# Patient Record
Sex: Male | Born: 1945
Health system: Southern US, Community
[De-identification: ages and names within clinical notes are randomized; demographics above are authoritative.]

## PROBLEM LIST (undated history)

## (undated) DIAGNOSIS — R519 Headache, unspecified: Secondary | ICD-10-CM

## (undated) DIAGNOSIS — R51 Headache: Secondary | ICD-10-CM

## (undated) DIAGNOSIS — E785 Hyperlipidemia, unspecified: Secondary | ICD-10-CM

## (undated) DIAGNOSIS — N200 Calculus of kidney: Secondary | ICD-10-CM

## (undated) DIAGNOSIS — R030 Elevated blood-pressure reading, without diagnosis of hypertension: Secondary | ICD-10-CM

## (undated) DIAGNOSIS — I10 Essential (primary) hypertension: Secondary | ICD-10-CM

## (undated) DIAGNOSIS — K219 Gastro-esophageal reflux disease without esophagitis: Secondary | ICD-10-CM

## (undated) DIAGNOSIS — M5412 Radiculopathy, cervical region: Secondary | ICD-10-CM

## (undated) DIAGNOSIS — N1831 Chronic kidney disease, stage 3a: Secondary | ICD-10-CM

## (undated) DIAGNOSIS — R7303 Prediabetes: Secondary | ICD-10-CM

## (undated) DIAGNOSIS — G43909 Migraine, unspecified, not intractable, without status migrainosus: Secondary | ICD-10-CM

## (undated) HISTORY — DX: Gastro-esophageal reflux disease without esophagitis: K21.9

## (undated) HISTORY — DX: Headache: R51

## (undated) HISTORY — DX: Chronic kidney disease, stage 3a: N18.31

## (undated) HISTORY — DX: Headache, unspecified: R51.9

## (undated) HISTORY — DX: Hyperlipidemia, unspecified: E78.5

## (undated) HISTORY — DX: Calculus of kidney: N20.0

## (undated) HISTORY — PX: HERNIA REPAIR: SHX51

## (undated) HISTORY — DX: Prediabetes: R73.03

## (undated) HISTORY — PX: LITHOTRIPSY: SUR834

## (undated) HISTORY — DX: Migraine, unspecified, not intractable, without status migrainosus: G43.909

## (undated) HISTORY — DX: Essential (primary) hypertension: I10

## (undated) HISTORY — DX: Radiculopathy, cervical region: M54.12

## (undated) HISTORY — DX: Elevated blood-pressure reading, without diagnosis of hypertension: R03.0

---

## 2012-11-15 LAB — PSA

## 2013-06-28 LAB — HM COLONOSCOPY

## 2015-02-19 DIAGNOSIS — G43109 Migraine with aura, not intractable, without status migrainosus: Secondary | ICD-10-CM | POA: Insufficient documentation

## 2016-02-20 ENCOUNTER — Telehealth: Payer: Self-pay | Admitting: Behavioral Health

## 2016-02-20 ENCOUNTER — Encounter: Payer: Self-pay | Admitting: Behavioral Health

## 2016-02-20 NOTE — Telephone Encounter (Signed)
Pre-Visit Call completed with patient and chart updated.   Pre-Visit Info documented in Specialty Comments under SnapShot.    

## 2016-02-23 ENCOUNTER — Encounter: Payer: Self-pay | Admitting: Family Medicine

## 2016-02-23 ENCOUNTER — Ambulatory Visit (INDEPENDENT_AMBULATORY_CARE_PROVIDER_SITE_OTHER): Payer: Medicare Other | Admitting: Family Medicine

## 2016-02-23 VITALS — BP 158/90 | HR 59 | Temp 97.6°F | Ht 67.0 in | Wt 154.2 lb

## 2016-02-23 DIAGNOSIS — I10 Essential (primary) hypertension: Secondary | ICD-10-CM | POA: Diagnosis not present

## 2016-02-23 DIAGNOSIS — Z130 Encounter for screening for diseases of the blood and blood-forming organs and certain disorders involving the immune mechanism: Secondary | ICD-10-CM

## 2016-02-23 DIAGNOSIS — M5010 Cervical disc disorder with radiculopathy, unspecified cervical region: Secondary | ICD-10-CM

## 2016-02-23 DIAGNOSIS — Z8639 Personal history of other endocrine, nutritional and metabolic disease: Secondary | ICD-10-CM

## 2016-02-23 DIAGNOSIS — Z125 Encounter for screening for malignant neoplasm of prostate: Secondary | ICD-10-CM | POA: Diagnosis not present

## 2016-02-23 LAB — CBC
HCT: 44.9 % (ref 39.0–52.0)
Hemoglobin: 15.2 g/dL (ref 13.0–17.0)
MCHC: 33.8 g/dL (ref 30.0–36.0)
MCV: 90.3 fl (ref 78.0–100.0)
Platelets: 222 10*3/uL (ref 150.0–400.0)
RBC: 4.97 Mil/uL (ref 4.22–5.81)
RDW: 14 % (ref 11.5–15.5)
WBC: 7.5 10*3/uL (ref 4.0–10.5)

## 2016-02-23 LAB — COMPREHENSIVE METABOLIC PANEL
ALT: 14 U/L (ref 0–53)
AST: 18 U/L (ref 0–37)
Albumin: 4.8 g/dL (ref 3.5–5.2)
Alkaline Phosphatase: 59 U/L (ref 39–117)
BILIRUBIN TOTAL: 1.1 mg/dL (ref 0.2–1.2)
BUN: 12 mg/dL (ref 6–23)
CALCIUM: 10.2 mg/dL (ref 8.4–10.5)
CHLORIDE: 102 meq/L (ref 96–112)
CO2: 29 meq/L (ref 19–32)
CREATININE: 1.06 mg/dL (ref 0.40–1.50)
GFR: 73.83 mL/min (ref >60.00)
GLUCOSE: 77 mg/dL (ref 70–99)
Potassium: 4.8 mEq/L (ref 3.5–5.1)
SODIUM: 140 meq/L (ref 135–145)
Total Protein: 7.3 g/dL (ref 6.0–8.3)

## 2016-02-23 LAB — LIPID PANEL
CHOL/HDL RATIO: 4
Cholesterol: 204 mg/dL — ABNORMAL HIGH (ref 0–200)
HDL: 46.9 mg/dL (ref >39.00)
LDL CALC: 126 mg/dL — AB (ref 0–99)
NONHDL: 157.15
TRIGLYCERIDES: 154 mg/dL — AB (ref 0.0–149.0)
VLDL: 30.8 mg/dL (ref 0.0–40.0)

## 2016-02-23 LAB — PSA: PSA: 1.77 ng/mL (ref 0.10–4.00)

## 2016-02-23 MED ORDER — GABAPENTIN 400 MG PO CAPS: 1200.0000 mg | ORAL_CAPSULE | Freq: Every day | ORAL | 5 refills | Status: DC

## 2016-02-23 MED ORDER — LISINOPRIL 5 MG PO TABS: 5.0000 mg | ORAL_TABLET | Freq: Every day | ORAL | 5 refills | Status: DC

## 2016-02-23 NOTE — Progress Notes (Signed)
[facility] [address], [place], [place] [address] [phone]  Date:  02/23/2016   Name:  Kendrick Ledyard   DOB:  01-10-1946   MRN:  [id number]  PCP:  Lamar Blinks, MD    Chief Complaint: Establish Care (Pt here to est care. Former pt of [facility]. )   History of Present Illness:  Paul Carlson is a 70 y.o. very pleasant male patient who presents with the following:  Here today to establish care.  He has a history of HTN (had been on lisinopril in the past, stopped taking it due to low BP.  He was last on it a couple of years ago) Also cervical radiculopathy and migraine HA He had been getting his care at [facility] but wanted to move somewhere closer to home.   He prefers to get any MRI, operations at [facility]  He has had neck problems for many years.  Most recent c spine MRI in 2015- showed multilevel, multifactoral degenerative change with moderate bilateral foraminal stenosis and mild central spinal stenosis at C4/5 and C5/6  He is a Theme park manager with the [employer] He is from [place] originally but has been in this area for some time He has 3 children with his wife.  He has 8 grands- they live in [place], [place], [place] and [place].    He last had labs about one year ago.   He last ate at breakfast time  His home BP was 140/80s at home He does walk for exercise- about 45- 60 min brisk walk daily He is married, he and his wife have a dog  There are no active problems to display for this patient.   Past Medical History:  Diagnosis Date  . Borderline hypertension   . Cervical radiculopathy   . Migraines     Past Surgical History:  Procedure Laterality Date  . HERNIA REPAIR    . LITHOTRIPSY      Social History  Substance Use Topics  . Smoking status: Not on file  . Smokeless tobacco: Not on file  . Alcohol use Not on file    Family History  Problem Relation Age of Onset  . Alzheimer's disease Father   .  Hypertension Father   . Kidney disease Father   . Arthritis Father   . Cancer Paternal Grandfather     Prostate Cancer  . Alcohol abuse Neg Hx     Allergies  Allergen Reactions  . Azithromycin Other (See Comments)    Pt. reported that it gave him severe stomach cramps.  . Pollen Extract Other (See Comments)    Pt. reports nasal congestion  . Penicillins Rash    Medication list has been reviewed and updated.  Current Outpatient Prescriptions on File Prior to Visit  Medication Sig Dispense Refill  . acetaminophen (TYLENOL) 650 MG CR tablet Take 650 mg by mouth at bedtime as needed for pain.    Marland Kitchen gabapentin (NEURONTIN) 400 MG capsule Take 1,200 mg by mouth at bedtime.    Marland Kitchen ibuprofen (ADVIL,MOTRIN) 200 MG tablet Take 600 mg by mouth as needed.     No current facility-administered medications on file prior to visit.     Review of Systems:  As per HPI- otherwise negative.   Physical Examination: Vitals:   02/23/16 1413  BP: (!) 184/94  Pulse: (!) 59  Temp: 97.6 F (36.4 C)   Body mass index is 24.15 kg/m.  Vitals:  02/23/16 1413  Weight: 154 lb 3.2 oz (69.9 kg)  Height: 5\' 7"  (1.702 m)   Body mass index is 24.15 kg/m. Ideal Body Weight: Weight in (lb) to have BMI = 25: 159.3  GEN: WDWN, NAD, Non-toxic, A & O x 3, looks well, healthy and fit for age 79: Atraumatic, Normocephalic. Neck supple. No masses, No LAD.  Bilateral TM wnl, oropharynx normal.  PEERL,EOMI.   Ears and Nose: No external deformity. CV: RRR, No M/G/R. No JVD. No thrill. No extra heart sounds. PULM: CTA B, no wheezes, crackles, rhonchi. No retractions. No resp. distress. No accessory muscle use. EXTR: No c/c/e NEURO Normal gait. Normal strength and DTR of both arms PSYCH: Normally interactive. Conversant. Not depressed or anxious appearing.  Calm demeanor.    Assessment and Plan: History of hyperlipidemia - Plan: Comprehensive metabolic panel, Lipid panel  Essential hypertension - Plan:  Comprehensive metabolic panel, lisinopril (PRINIVIL,ZESTRIL) 5 MG tablet  Screening for prostate cancer - Plan: PSA  Screening for deficiency anemia - Plan: CBC  Cervical disc disorder with radiculopathy of cervical region - Plan: gabapentin (NEURONTIN) 400 MG capsule  Here today to establish care Will check labs today- history of hyperlipidemia and he is not on chl medication at this time Continue gabapentin for his chronic neck issues.  Recent MRI showed foraminal and central spinal stenosis Will start on 5mg  of lisinopril today, he will continue to monitor his BP Await labs- plan follow-up pending lab results He prefers to try and get his pneumonia vaccine at the drug store if he can  Signed Lamar Blinks, MD

## 2016-02-23 NOTE — Patient Instructions (Signed)
It was very nice to see you today- have a wonderful beach trip! I will be in touch with your labs asap Please start on 5mg  of lisinopril once a day for your blood pressure.  However if you are running lower than 120/80 on a regular basis please let me know   Please do schedule a visit with your GI doctor at the [facility] [phone] for a colonoscopy later on this year or early next year

## 2016-02-23 NOTE — Progress Notes (Signed)
Pre visit review using our clinic review tool, if applicable. No additional management support is needed unless otherwise documented below in the visit note. 

## 2016-03-04 ENCOUNTER — Encounter: Payer: Self-pay | Admitting: Family Medicine

## 2016-08-23 ENCOUNTER — Other Ambulatory Visit: Payer: Self-pay | Admitting: Family Medicine

## 2016-08-23 DIAGNOSIS — M5010 Cervical disc disorder with radiculopathy, unspecified cervical region: Secondary | ICD-10-CM

## 2016-11-08 ENCOUNTER — Ambulatory Visit (INDEPENDENT_AMBULATORY_CARE_PROVIDER_SITE_OTHER): Payer: Medicare Other | Admitting: Family Medicine

## 2016-11-08 ENCOUNTER — Encounter: Payer: Self-pay | Admitting: Family Medicine

## 2016-11-08 VITALS — BP 138/60 | HR 65 | Temp 98.0°F | Ht 67.0 in | Wt 152.6 lb

## 2016-11-08 DIAGNOSIS — J301 Allergic rhinitis due to pollen: Secondary | ICD-10-CM

## 2016-11-08 MED ORDER — CETIRIZINE HCL 10 MG PO TABS: 10.0000 mg | ORAL_TABLET | Freq: Every day | ORAL | 2 refills | Status: DC

## 2016-11-08 MED ORDER — FLUTICASONE PROPIONATE 50 MCG/ACT NA SUSP: 2.0000 | Freq: Every day | NASAL | 2 refills | Status: DC

## 2016-11-08 NOTE — Progress Notes (Signed)
Chief Complaint  Patient presents with  . URI    swollen gland x 2 mos, drainage,cough(product-white)    Paul Carlson here for URI complaints.  Duration: 2 months  Associated symptoms: sinus congestion, rhinorrhea, ear fullness, sore throat, chest tightness and cough Denies: sinus pain, itchy watery eyes, ear drainage, shortness of breath, myalgia and fevers/rigors Treatment to date: Dayquil, Cough drops Sick contacts: No  ROS:  Const: Denies fevers HEENT: As noted in HPI Lungs: No SOB  Past Medical History:  Diagnosis Date  . Borderline hypertension   . Cervical radiculopathy   . Frequent headaches   . GERD (gastroesophageal reflux disease)   . Migraines    Family History  Problem Relation Age of Onset  . Alzheimer's disease Father   . Hypertension Father   . Kidney disease Father   . Arthritis Father   . Cancer Paternal Grandfather     Prostate Cancer  . Alcohol abuse Neg Hx     BP 138/60 (BP Location: Left Arm, Patient Position: Sitting, Cuff Size: Normal)   Pulse 65   Temp 98 F (36.7 C) (Oral)   Ht 5\' 7"  (1.702 m)   Wt 152 lb 9.6 oz (69.2 kg)   SpO2 98%   BMI 23.90 kg/m  General: Awake, alert, appears stated age HEENT: AT, [place], ears patent b/l and TM's neg, nares patent w/o discharge, pharynx pink and without exudates, MMM Neck: No masses or asymmetry, Noted enlarged submandibular glands b/l Heart: RRR, no bruits Lungs: CTAB, no accessory muscle use Psych: Age appropriate judgment and insight, normal mood and affect   Allergic rhinitis due to pollen, unspecified seasonality - Plan: cetirizine (ZYRTEC ALLERGY) 10 MG tablet, fluticasone (FLONASE) 50 MCG/ACT nasal spray  Orders as above. Continue to push fluids, practice good hand hygiene, cover mouth when coughing. F/u prn. If starting to experience fevers, shaking, or shortness of breath, seek immediate care. Pt voiced understanding and agreement to the plan.  [place], DO 11/08/16 2:22  PM

## 2016-11-08 NOTE — Patient Instructions (Signed)
Claritin (loratadine), Allegra (fexofenadine), Zyrtec (cetirizine); these are listed in order from weakest to strongest. Generic, and therefore cheaper, options are in the parentheses.   Flonase (fluticasone); nasal spray that is over the counter. 2 sprays each nostril, once daily. Aim towards the same side eye when you spray.  There are available OTC, and the generic versions, which may be cheaper, are in parentheses. Show this to a pharmacist if you have trouble finding any of these items.  Continue to push fluids, practice good hand hygiene, and cover your mouth if you cough.  If you start having fevers, shaking or shortness of breath, seek immediate care.  

## 2016-11-08 NOTE — Progress Notes (Signed)
Pre visit review using our clinic review tool, if applicable. No additional management support is needed unless otherwise documented below in the visit note. 

## 2017-01-20 DIAGNOSIS — S63502A Unspecified sprain of left wrist, initial encounter: Secondary | ICD-10-CM | POA: Diagnosis not present

## 2017-01-20 DIAGNOSIS — M25532 Pain in left wrist: Secondary | ICD-10-CM | POA: Diagnosis not present

## 2017-02-21 ENCOUNTER — Other Ambulatory Visit: Payer: Self-pay | Admitting: Family Medicine

## 2017-02-21 DIAGNOSIS — M5010 Cervical disc disorder with radiculopathy, unspecified cervical region: Secondary | ICD-10-CM

## 2017-03-03 ENCOUNTER — Ambulatory Visit: Payer: Medicare Other | Admitting: Internal Medicine

## 2017-03-22 DIAGNOSIS — H109 Unspecified conjunctivitis: Secondary | ICD-10-CM | POA: Diagnosis not present

## 2017-03-22 DIAGNOSIS — H5712 Ocular pain, left eye: Secondary | ICD-10-CM | POA: Diagnosis not present

## 2017-03-22 DIAGNOSIS — Z87891 Personal history of nicotine dependence: Secondary | ICD-10-CM | POA: Diagnosis not present

## 2017-03-22 DIAGNOSIS — H578 Other specified disorders of eye and adnexa: Secondary | ICD-10-CM | POA: Diagnosis not present

## 2017-03-22 DIAGNOSIS — H1032 Unspecified acute conjunctivitis, left eye: Secondary | ICD-10-CM | POA: Diagnosis not present

## 2017-03-22 DIAGNOSIS — H538 Other visual disturbances: Secondary | ICD-10-CM | POA: Diagnosis not present

## 2017-03-22 DIAGNOSIS — Z880 Allergy status to penicillin: Secondary | ICD-10-CM | POA: Diagnosis not present

## 2017-03-22 DIAGNOSIS — Z881 Allergy status to other antibiotic agents status: Secondary | ICD-10-CM | POA: Diagnosis not present

## 2017-03-22 DIAGNOSIS — I10 Essential (primary) hypertension: Secondary | ICD-10-CM | POA: Diagnosis not present

## 2017-03-22 DIAGNOSIS — L299 Pruritus, unspecified: Secondary | ICD-10-CM | POA: Diagnosis not present

## 2017-03-23 ENCOUNTER — Telehealth: Payer: Self-pay | Admitting: Behavioral Health

## 2017-03-23 NOTE — Telephone Encounter (Signed)
[employer] note received via fax  Call Date: 03/22/17 Time: 5:11 PM   Caller: Paul Carlson Return number: [phone]  Nurse: Margretta Ditty, RN  Chief Complaint: Eye redness  Reason for call: Caller said that his left eye lid is swollen, red, draining, itching & pain. The temple area is numb & painful with blurred vision. Swelling of eyelid is down to pupil. This has been going on for a week. No fever.  Guideline: Eye-Pus or Discharge  Disposition: See Physician within 4 Hours  Called & spoke with patient. He voiced that he was seen at [facility] the same day as the call & informed that he had worsening pink eye; patient was given an antibiotic for his eye to take for 10 days & see an ophthalmologist if symptoms worsen.

## 2017-04-23 ENCOUNTER — Other Ambulatory Visit: Payer: Self-pay | Admitting: Family Medicine

## 2017-04-23 DIAGNOSIS — M5010 Cervical disc disorder with radiculopathy, unspecified cervical region: Secondary | ICD-10-CM

## 2017-05-16 ENCOUNTER — Telehealth: Payer: Self-pay | Admitting: Family Medicine

## 2017-05-16 NOTE — Telephone Encounter (Signed)
Spoke with patient regarding AWV. Explained that difference between AWV and CPE. Pt stated that he will give office a call back to schedule both appointments because he needs a CPE before the end of the year.

## 2017-06-25 ENCOUNTER — Other Ambulatory Visit: Payer: Self-pay | Admitting: Family Medicine

## 2017-06-25 DIAGNOSIS — M5010 Cervical disc disorder with radiculopathy, unspecified cervical region: Secondary | ICD-10-CM

## 2017-06-28 ENCOUNTER — Other Ambulatory Visit: Payer: Self-pay | Admitting: Family Medicine

## 2017-06-28 DIAGNOSIS — M5010 Cervical disc disorder with radiculopathy, unspecified cervical region: Secondary | ICD-10-CM

## 2017-07-20 DIAGNOSIS — M5412 Radiculopathy, cervical region: Secondary | ICD-10-CM | POA: Diagnosis not present

## 2017-07-20 DIAGNOSIS — G5603 Carpal tunnel syndrome, bilateral upper limbs: Secondary | ICD-10-CM | POA: Diagnosis not present

## 2017-07-20 DIAGNOSIS — R200 Anesthesia of skin: Secondary | ICD-10-CM | POA: Insufficient documentation

## 2017-07-20 DIAGNOSIS — M5030 Other cervical disc degeneration, unspecified cervical region: Secondary | ICD-10-CM | POA: Diagnosis not present

## 2017-07-20 DIAGNOSIS — M4802 Spinal stenosis, cervical region: Secondary | ICD-10-CM | POA: Diagnosis not present

## 2017-07-27 DIAGNOSIS — M5412 Radiculopathy, cervical region: Secondary | ICD-10-CM | POA: Diagnosis not present

## 2017-07-27 DIAGNOSIS — M436 Torticollis: Secondary | ICD-10-CM | POA: Diagnosis not present

## 2017-07-27 DIAGNOSIS — R200 Anesthesia of skin: Secondary | ICD-10-CM | POA: Diagnosis not present

## 2017-07-27 DIAGNOSIS — R202 Paresthesia of skin: Secondary | ICD-10-CM | POA: Diagnosis not present

## 2017-08-03 DIAGNOSIS — R202 Paresthesia of skin: Secondary | ICD-10-CM | POA: Diagnosis not present

## 2017-08-03 DIAGNOSIS — M436 Torticollis: Secondary | ICD-10-CM | POA: Diagnosis not present

## 2017-08-03 DIAGNOSIS — M5412 Radiculopathy, cervical region: Secondary | ICD-10-CM | POA: Diagnosis not present

## 2017-08-03 DIAGNOSIS — R200 Anesthesia of skin: Secondary | ICD-10-CM | POA: Diagnosis not present

## 2017-08-10 DIAGNOSIS — R202 Paresthesia of skin: Secondary | ICD-10-CM | POA: Diagnosis not present

## 2017-08-10 DIAGNOSIS — M436 Torticollis: Secondary | ICD-10-CM | POA: Diagnosis not present

## 2017-08-10 DIAGNOSIS — M5412 Radiculopathy, cervical region: Secondary | ICD-10-CM | POA: Diagnosis not present

## 2017-08-10 DIAGNOSIS — R200 Anesthesia of skin: Secondary | ICD-10-CM | POA: Diagnosis not present

## 2017-08-23 NOTE — Progress Notes (Addendum)
[facility] [address], [place], [place] [address] [phone]  Date:  08/24/2017   Name:  VIRAAJ Carlson   DOB:  [birth year]   MRN:  [id number]  PCP:  Darreld Mclean, MD    Chief Complaint: Shoulder Pain   History of Present Illness:  Paul Carlson is a 72 y.o. very pleasant male patient who presents with the following:  I last saw him in the office 02/2016: Here today to establish care.  He has a history of HTN (had been on lisinopril in the past, stopped taking it due to low BP.  He was last on it a couple of years ago) Also cervical radiculopathy and migraine HA He had been getting his care at [facility] but wanted to move somewhere closer to home.   He prefers to get any MRI, operations at [facility]  He has had neck problems for many years.  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  He has 8 grands- they live in [place], [place], [place] and [place].   His home BP was 140/80s at home He does walk for exercise- about 45- 60 min brisk walk daily  Hep C: not done yet, order today Pneumonia vaccine: Flu vaccine: done Labs done 8/17 colonoscopy 2014- he was asked to come back in 3 years   He is being seen at the spine center in [place], Dr. Octavio Manns. They referred him to pain management. He is on gabapentin and pain mangt added tylenol and ibuprofen. They talked about doing an injection but he felt like he was doing better so they did not do it at that time.   He did some PT recently but does not feel like he is getting better  He had his most recent MRI in 2015  BP Readings from Last 3 Encounters:  08/24/17 (!) 144/84  11/08/16 138/60   02/23/16 (!) 158/90   He did have breakfast this am- sausage biscuit with cheese He stopped taking his BP meds as he had been forgetting to take it and just stopped getting it refilled However he is willing to start taking this again He is overdue for a colonoscopy and needs a referral to GI  He brings in a typed page long document regarding the symptoms in his right hand and arm - will scan into chart for him  Patient Active Problem List   Diagnosis Date Noted  . History of hyperlipidemia 02/23/2016  . Essential hypertension 02/23/2016  . Cervical disc disorder with radiculopathy of cervical region 02/23/2016  . Migraine with aura and without status migrainosus, not intractable 02/19/2015    Past Medical History:  Diagnosis Date  . Borderline hypertension   . Cervical radiculopathy   . Frequent headaches   . GERD (gastroesophageal reflux disease)   . Migraines     Past Surgical History:  Procedure Laterality Date  . HERNIA REPAIR    . LITHOTRIPSY      Social History   Tobacco Use  . Smoking status: Never Smoker  . Smokeless tobacco: Never Used  Substance Use Topics  . Alcohol use: No  . Drug use: Not on  file    Family History  Problem Relation Age of Onset  . Alzheimer's disease Father   . Hypertension Father   . Kidney disease Father   . Arthritis Father   . Cancer Paternal Grandfather        Prostate Cancer  . Alcohol abuse Neg Hx     Allergies  Allergen Reactions  . Azithromycin Other (See Comments)    Pt. reported that it gave him severe stomach cramps.  . Pollen Extract Other (See Comments)    Pt. reports nasal congestion  . Penicillins Rash    Medication list has been reviewed and updated.  Current Outpatient Medications on File Prior to Visit  Medication Sig Dispense Refill  . acetaminophen (TYLENOL) 650 MG CR tablet Take 650 mg by mouth at bedtime as needed for pain.    Marland Kitchen aspirin-acetaminophen-caffeine (EXCEDRIN MIGRAINE) 250-250-65 MG  tablet Take 1-2 tablets by mouth as needed    . ibuprofen (ADVIL,MOTRIN) 200 MG tablet Take 600 mg by mouth as needed.    . cetirizine (ZYRTEC ALLERGY) 10 MG tablet Take 1 tablet (10 mg total) by mouth daily. (Patient not taking: Reported on 08/24/2017) 30 tablet 2  . fluticasone (FLONASE) 50 MCG/ACT nasal spray Place 2 sprays into both nostrils daily. (Patient not taking: Reported on 08/24/2017) 16 g 2   No current facility-administered medications on file prior to visit.     Review of Systems:  As per HPI- otherwise negative. No fever or chills No CP or SOB  Physical Examination: Vitals:   08/24/17 1210  BP: (!) 144/84  Pulse: 66  Resp: 16  Temp: 98.5 F (36.9 C)  SpO2: 97%   Vitals:   08/24/17 1210  Weight: 156 lb 3.2 oz (70.9 kg)  Height: 5\' 7"  (1.702 m)   Body mass index is 24.46 kg/m. Ideal Body Weight: Weight in (lb) to have BMI = 25: 159.3  GEN: WDWN, NAD, Non-toxic, A & O x 3, looks well, normal weight HEENT: Atraumatic, Normocephalic. Neck supple. No masses, No LAD. Ears and Nose: No external deformity. CV: RRR, No M/G/R. No JVD. No thrill. No extra heart sounds. PULM: CTA B, no wheezes, crackles, rhonchi. No retractions. No resp. distress. No accessory muscle use. EXTR: No c/c/e NEURO Normal gait.  PSYCH: Normally interactive. Conversant. Not depressed or anxious appearing.  Calm demeanor.  Normal strength of BUE, cannot reproduce numbness in right arm or pain with ROM of arms or neck   Assessment and Plan: History of hyperlipidemia - Plan: Lipid panel  Essential hypertension - Plan: lisinopril (PRINIVIL,ZESTRIL) 5 MG tablet, CBC, Comprehensive metabolic panel  Cervical disc disorder with radiculopathy of cervical region - Plan: predniSONE (DELTASONE) 20 MG tablet, gabapentin (NEURONTIN) 400 MG capsule  Screening for colon cancer - Plan: Ambulatory referral to Gastroenterology  Screening for prostate cancer - Plan: PSA  Encounter for hepatitis C  screening test for low risk patient - Plan: Hepatitis C antibody  Following up after a long absence with a few concerns It was nice to see you again today!  Please come in for fasting labs at your convenience and we will check your cholesterol, etc Please start back on your lisinopril for your blood pressure I also refilled your gabapentin Please use the prednisone for 6 days- I am hoping that this will help with your neck and arm symptoms. While you are on the prednisone do not take NSAIDs (such as ibuprofen) Please do contact Dr. Octavio Manns to arrange a follow-up visit for  your neck.  He would probably be the best person to order an MRI for you as he can also advise you about what to do with the results I will set you up to see GI in [facility] for a repeat colonoscopy   Restart lisinopril Will try prednisone for his neck pain.  Explained that generally once a spine specialist is seeing someone for a spine issue I would defer further MRI to that specialist.  He will schedule to see Dr. Octavio Manns soon Signed Lamar Blinks, MD 2/19- received his labs- message to pt  Hepatitis C screening is negative as expected Your blood count is normal Metabolic profile is also normal PSA is in normal range and lower than in 2017, which is always reassuring  Lab Results      Component                Value               Date                      PSA                      1.31                08/29/2017                PSA                      1.77                02/23/2016                Your cholesterol is not bad, but your LDL is a bit higher than I would like (goal under 100).  A low dose of cholesterol medication will likely reduce this number and may reduce your risk of cardiovascular disease in the future.  Is this something that you would be interested in using?  You can reply to me directly here.  Otherwise please come and see me in 4-6 months to check on your blood pressure.  Take care!

## 2017-08-24 ENCOUNTER — Other Ambulatory Visit: Payer: Self-pay | Admitting: Family Medicine

## 2017-08-24 ENCOUNTER — Ambulatory Visit (INDEPENDENT_AMBULATORY_CARE_PROVIDER_SITE_OTHER): Payer: Medicare Other | Admitting: Family Medicine

## 2017-08-24 ENCOUNTER — Encounter: Payer: Self-pay | Admitting: Family Medicine

## 2017-08-24 VITALS — BP 144/84 | HR 66 | Temp 98.5°F | Resp 16 | Ht 67.0 in | Wt 156.2 lb

## 2017-08-24 DIAGNOSIS — Z1159 Encounter for screening for other viral diseases: Secondary | ICD-10-CM

## 2017-08-24 DIAGNOSIS — Z1211 Encounter for screening for malignant neoplasm of colon: Secondary | ICD-10-CM

## 2017-08-24 DIAGNOSIS — Z8639 Personal history of other endocrine, nutritional and metabolic disease: Secondary | ICD-10-CM | POA: Diagnosis not present

## 2017-08-24 DIAGNOSIS — Z125 Encounter for screening for malignant neoplasm of prostate: Secondary | ICD-10-CM | POA: Diagnosis not present

## 2017-08-24 DIAGNOSIS — I10 Essential (primary) hypertension: Secondary | ICD-10-CM

## 2017-08-24 DIAGNOSIS — M5010 Cervical disc disorder with radiculopathy, unspecified cervical region: Secondary | ICD-10-CM

## 2017-08-24 MED ORDER — GABAPENTIN 400 MG PO CAPS: ORAL_CAPSULE | ORAL | 11 refills | Status: DC

## 2017-08-24 MED ORDER — LISINOPRIL 5 MG PO TABS: 5.0000 mg | ORAL_TABLET | Freq: Every day | ORAL | 11 refills | Status: DC

## 2017-08-24 MED ORDER — PREDNISONE 20 MG PO TABS: ORAL_TABLET | ORAL | 0 refills | Status: DC

## 2017-08-24 NOTE — Patient Instructions (Addendum)
It was nice to see you again today!  Please come in for fasting labs at your convenience and we will check your cholesterol, etc Please start back on your lisinopril for your blood pressure I also refilled your gabapentin Please use the prednisone for 6 days- I am hoping that this will help with your neck and arm symptoms. While you are on the prednisone do not take NSAIDs (such as ibuprofen) Please do contact Dr. Octavio Manns to arrange a follow-up visit for your neck.  He would probably be the best person to order an MRI for you as he can also advise you about what to do with the results  I will set you up to see GI in [facility] for a repeat colonoscopy

## 2017-08-26 ENCOUNTER — Other Ambulatory Visit: Payer: Self-pay | Admitting: Emergency Medicine

## 2017-08-26 DIAGNOSIS — M5010 Cervical disc disorder with radiculopathy, unspecified cervical region: Secondary | ICD-10-CM

## 2017-08-26 MED ORDER — GABAPENTIN 400 MG PO CAPS
ORAL_CAPSULE | ORAL | 1 refills | Status: DC
Start: 2017-08-26 — End: 2018-08-17

## 2017-08-29 ENCOUNTER — Other Ambulatory Visit (INDEPENDENT_AMBULATORY_CARE_PROVIDER_SITE_OTHER): Payer: Medicare Other

## 2017-08-29 DIAGNOSIS — Z125 Encounter for screening for malignant neoplasm of prostate: Secondary | ICD-10-CM | POA: Diagnosis not present

## 2017-08-29 DIAGNOSIS — Z1159 Encounter for screening for other viral diseases: Secondary | ICD-10-CM

## 2017-08-29 DIAGNOSIS — Z8639 Personal history of other endocrine, nutritional and metabolic disease: Secondary | ICD-10-CM | POA: Diagnosis not present

## 2017-08-29 DIAGNOSIS — I10 Essential (primary) hypertension: Secondary | ICD-10-CM

## 2017-08-29 LAB — COMPREHENSIVE METABOLIC PANEL
ALBUMIN: 4.2 g/dL (ref 3.5–5.2)
ALT: 13 U/L (ref 0–53)
AST: 12 U/L (ref 0–37)
Alkaline Phosphatase: 62 U/L (ref 39–117)
BUN: 16 mg/dL (ref 6–23)
CALCIUM: 9.2 mg/dL (ref 8.4–10.5)
CHLORIDE: 105 meq/L (ref 96–112)
CO2: 30 mEq/L (ref 19–32)
Creatinine, Ser: 1.07 mg/dL (ref 0.40–1.50)
GFR: 72.71 mL/min (ref >60.00)
Glucose, Bld: 90 mg/dL (ref 70–99)
POTASSIUM: 4.6 meq/L (ref 3.5–5.1)
SODIUM: 140 meq/L (ref 135–145)
Total Bilirubin: 0.8 mg/dL (ref 0.2–1.2)
Total Protein: 6.5 g/dL (ref 6.0–8.3)

## 2017-08-29 LAB — LIPID PANEL
CHOLESTEROL: 188 mg/dL (ref 0–200)
HDL: 52.3 mg/dL (ref >39.00)
LDL Cholesterol: 115 mg/dL — ABNORMAL HIGH (ref 0–99)
NonHDL: 135.73
Total CHOL/HDL Ratio: 4
Triglycerides: 103 mg/dL (ref 0.0–149.0)
VLDL: 20.6 mg/dL (ref 0.0–40.0)

## 2017-08-29 LAB — CBC
HEMATOCRIT: 44 % (ref 39.0–52.0)
HEMOGLOBIN: 14.8 g/dL (ref 13.0–17.0)
MCHC: 33.7 g/dL (ref 30.0–36.0)
MCV: 92.6 fl (ref 78.0–100.0)
Platelets: 208 10*3/uL (ref 150.0–400.0)
RBC: 4.75 Mil/uL (ref 4.22–5.81)
RDW: 13.8 % (ref 11.5–15.5)
WBC: 9.2 10*3/uL (ref 4.0–10.5)

## 2017-08-29 LAB — PSA: PSA: 1.31 ng/mL (ref 0.10–4.00)

## 2017-08-30 ENCOUNTER — Encounter: Payer: Self-pay | Admitting: Family Medicine

## 2017-08-30 LAB — HEPATITIS C ANTIBODY
Hepatitis C Ab: NONREACTIVE
SIGNAL TO CUT-OFF: 0.01 (ref <1.00)

## 2017-11-03 DIAGNOSIS — Z86010 Personal history of colonic polyps: Secondary | ICD-10-CM | POA: Diagnosis not present

## 2017-11-03 DIAGNOSIS — D123 Benign neoplasm of transverse colon: Secondary | ICD-10-CM | POA: Diagnosis not present

## 2017-11-03 DIAGNOSIS — D122 Benign neoplasm of ascending colon: Secondary | ICD-10-CM | POA: Diagnosis not present

## 2017-11-03 DIAGNOSIS — Z1211 Encounter for screening for malignant neoplasm of colon: Secondary | ICD-10-CM | POA: Diagnosis not present

## 2017-11-03 DIAGNOSIS — K5730 Diverticulosis of large intestine without perforation or abscess without bleeding: Secondary | ICD-10-CM | POA: Diagnosis not present

## 2017-11-03 DIAGNOSIS — D125 Benign neoplasm of sigmoid colon: Secondary | ICD-10-CM | POA: Diagnosis not present

## 2018-05-24 ENCOUNTER — Telehealth: Payer: Self-pay | Admitting: Family Medicine

## 2018-05-24 DIAGNOSIS — E785 Hyperlipidemia, unspecified: Secondary | ICD-10-CM

## 2018-05-24 NOTE — Telephone Encounter (Signed)
-----   Message from Dennis Bast, RN sent at 05/24/2018  9:28 AM EST ----- Pt scheduled for AWV tomorrow. He last had labs in Feb and your note said to repeat in about 6 months. Would you like to order labs for tomorrow or have him schedule to see you?

## 2018-05-24 NOTE — Progress Notes (Addendum)
Subjective:   Paul Carlson is a 72 y.o. male who presents for an Initial Medicare Annual Wellness Visit.  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 Enjoys camping with wife and two dogs.  Review of Systems No ROS.  Medicare Wellness Visit. Additional risk factors are reflected in the social history. Cardiac Risk Factors include: advanced age (>32men, >37 women);hypertension;male gender Sleep patterns: difficulty due to pain in shoulders. Takes meds for pain. Rarely naps. Home Safety/Smoke Alarms: Feels safe in home. Smoke alarms in place. Lives with wife and 2 dogs in one story home.   Eye- [facility] yearly per pt.   Male:   CCS- per pt -last done in [facility] 07/2017. PSA-  Lab Results  Component Value Date   PSA 1.31 08/29/2017   PSA 1.77 02/23/2016   PSA patient reported 11/15/2012      Objective:    Today's Vitals   05/25/18 1139  BP: (!) 142/78  Pulse: (!) 59  SpO2: 98%  Weight: 150 lb 3.2 oz (68.1 kg)  Height: 5\' 7"  (1.702 m)   Body mass index is 23.52 kg/m.  Advanced Directives 05/25/2018  Does Patient Have a Medical Advance Directive? No  Would patient like information on creating a medical advance directive? Yes (MAU/Ambulatory/Procedural Areas - Information given)    Current Medications (verified) Outpatient Encounter Medications as of 05/25/2018  Medication Sig  . acetaminophen (TYLENOL) 650 MG CR tablet Take 650 mg by mouth at bedtime as needed for pain.  Marland Kitchen aspirin-acetaminophen-caffeine (EXCEDRIN MIGRAINE) 250-250-65 MG tablet Take 1-2 tablets by mouth as needed  . gabapentin (NEURONTIN) 400 MG capsule TAKE 3 CAPSULES AT BEDTIME  . ibuprofen (ADVIL,MOTRIN) 200 MG tablet Take 600 mg by mouth as needed.  Marland Kitchen lisinopril (PRINIVIL,ZESTRIL) 5 MG tablet Take 1 tablet (5 mg total) by mouth daily.  . [DISCONTINUED] predniSONE (DELTASONE) 20 MG tablet Take 2 pills for 3 days and then 1 pill for 3 days  . cetirizine (ZYRTEC  ALLERGY) 10 MG tablet Take 1 tablet (10 mg total) by mouth daily. (Patient not taking: Reported on 08/24/2017)  . fluticasone (FLONASE) 50 MCG/ACT nasal spray Place 2 sprays into both nostrils daily. (Patient not taking: Reported on 08/24/2017)   No facility-administered encounter medications on file as of 05/25/2018.     Allergies (verified) Azithromycin; Pollen extract; and Penicillins   History: Past Medical History:  Diagnosis Date  . Borderline hypertension   . Cervical radiculopathy   . Frequent headaches   . GERD (gastroesophageal reflux disease)   . Migraines    Past Surgical History:  Procedure Laterality Date  . HERNIA REPAIR    . LITHOTRIPSY     Family History  Problem Relation Age of Onset  . Alzheimer's disease Father   . Hypertension Father   . Kidney disease Father   . Arthritis Father   . Cancer Paternal Grandfather        Prostate Cancer  . Alcohol abuse Neg Hx    Social History   Socioeconomic History  . Marital status: Married    Spouse name: Not on file  . Number of children: Not on file  . Years of education: Not on file  . Highest education level: Not on file  Occupational History  . Occupation: Theme park manager  Social Needs  . Financial resource strain: Not on file  . Food insecurity:    Worry: Not on file    Inability: Not on file  .  Transportation needs:    Medical: Not on file    Non-medical: Not on file  Tobacco Use  . Smoking status: Never Smoker  . Smokeless tobacco: Never Used  Substance and Sexual Activity  . Alcohol use: No  . Drug use: Not on file  . Sexual activity: Not on file  Lifestyle  . Physical activity:    Days per week: Not on file    Minutes per session: Not on file  . Stress: Not on file  Relationships  . Social connections:    Talks on phone: Not on file    Gets together: Not on file    Attends religious service: Not on file    Active member of club or organization: Not on file    Attends meetings of clubs or  organizations: Not on file    Relationship status: Not on file  Other Topics Concern  . Not on file  Social History Narrative  . Not on file   Tobacco Counseling Counseling given: Not Answered   Clinical Intake: Pain : No/denies pain  Activities of Daily Living In your present state of health, do you have any difficulty performing the following activities: 05/25/2018  Hearing? N  Vision? N  Difficulty concentrating or making decisions? N  Walking or climbing stairs? N  Dressing or bathing? N  Doing errands, shopping? N  Preparing Food and eating ? N  Using the Toilet? N  In the past six months, have you accidently leaked urine? N  Do you have problems with loss of bowel control? N  Managing your Medications? N  Managing your Finances? N  Housekeeping or managing your Housekeeping? N  Some recent data might be hidden     Immunizations and Health Maintenance Immunization History  Administered Date(s) Administered  . Tdap 02/19/2015   There are no preventive care reminders to display for this patient.  Patient Care Team: Copland, Gay Filler, MD as PCP - General (Family Medicine)  Indicate any recent Medical Services you may have received from other than [facility] providers in the past year (date may be approximate).    Assessment:   This is a routine wellness examination for Paul Carlson. Physical assessment deferred to PCP.  Hearing/Vision screen Hearing Screening Comments: Able to hear conversational tones w/o difficulty. No issues reported.   Vision Screening Comments: Pt states he recently had eye exam with [facility].  Dietary issues and exercise activities discussed: Current Exercise Habits: The patient does not participate in regular exercise at present, Exercise limited by: None identified Diet (meal preparation, eat out, water intake, caffeinated beverages, dairy products, fruits and vegetables): in general, an "unhealthy" diet  Goals    . DIET - INCREASE WATER  INTAKE      Depression Screen PHQ 2/9 Scores 05/25/2018 08/24/2017  PHQ - 2 Score 0 0    Fall Risk Fall Risk  05/25/2018 08/24/2017  Falls in the past year? 1 No  Number falls in past yr: 0 -  Injury with Fall? 0 -    Cognitive Function:  Ad8 score reviewed for issues:  Issues making decisions:no  Less interest in hobbies / activities:no  Repeats questions, stories (family complaining):no  Trouble using ordinary gadgets (microwave, computer, phone):no  Forgets the month or year: no  Mismanaging finances: no  Remembering appts:no  Daily problems with thinking and/or memory:no Ad8 score is=0        Screening Tests Health Maintenance  Topic Date Due  . INFLUENZA VACCINE  05/26/2019 (  Originally 02/09/2018)  . PNA vac Low Risk Adult (1 of 2 - PCV13) 05/26/2019 (Originally 02/06/2013)  . COLONOSCOPY  06/29/2023  . TETANUS/TDAP  02/18/2025  . Hepatitis C Screening  Completed      Plan:    Please schedule your next medicare wellness visit with me in 1 yr.  Eat heart healthy diet (full of fruits, vegetables, whole grains, lean protein, water--limit salt, fat, and sugar intake) and increase physical activity as tolerated.  Bring a copy of your living will and/or healthcare power of attorney to your next office visit.  Please bring colonoscopy report with you to next visit.  I have personally reviewed and noted the following in the patient's chart:   . Medical and social history . Use of alcohol, tobacco or illicit drugs  . Current medications and supplements . Functional ability and status . Nutritional status . Physical activity . Advanced directives . List of other physicians . Hospitalizations, surgeries, and ER visits in previous 12 months . Vitals . Screenings to include cognitive, depression, and falls . Referrals and appointments  In addition, I have reviewed and discussed with patient certain preventive protocols, quality metrics, and best practice  recommendations. A written personalized care plan for preventive services as well as general preventive health recommendations were provided to patient.     Shela Nevin, RN   05/25/2018   I have reviewed the above note by Ms. Vevelyn Royals and agree with her documentation Denny Peon MD

## 2018-05-24 NOTE — Telephone Encounter (Signed)
I ordered a lipid panel for him-if you would please have him get his blood drawn

## 2018-05-25 ENCOUNTER — Encounter: Payer: Self-pay | Admitting: *Deleted

## 2018-05-25 ENCOUNTER — Ambulatory Visit (INDEPENDENT_AMBULATORY_CARE_PROVIDER_SITE_OTHER): Payer: Medicare Other | Admitting: *Deleted

## 2018-05-25 VITALS — BP 142/78 | HR 59 | Ht 67.0 in | Wt 150.2 lb

## 2018-05-25 DIAGNOSIS — E785 Hyperlipidemia, unspecified: Secondary | ICD-10-CM

## 2018-05-25 DIAGNOSIS — Z0000 Encounter for general adult medical examination without abnormal findings: Secondary | ICD-10-CM | POA: Diagnosis not present

## 2018-05-25 LAB — LIPID PANEL
CHOL/HDL RATIO: 4
Cholesterol: 199 mg/dL (ref 0–200)
HDL: 51.4 mg/dL (ref >39.00)
LDL CALC: 129 mg/dL — AB (ref 0–99)
NonHDL: 147.3
Triglycerides: 93 mg/dL (ref 0.0–149.0)
VLDL: 18.6 mg/dL (ref 0.0–40.0)

## 2018-05-25 NOTE — Addendum Note (Signed)
Addended by: Magdalene Molly A on: 05/25/2018 12:03 PM   Modules accepted: Orders

## 2018-05-25 NOTE — Patient Instructions (Signed)
Please schedule your next medicare wellness visit with me in 1 yr.  Eat heart healthy diet (full of fruits, vegetables, whole grains, lean protein, water--limit salt, fat, and sugar intake) and increase physical activity as tolerated.  Bring a copy of your living will and/or healthcare power of attorney to your next office visit.  Please bring colonoscopy report with you to next visit.   Paul Carlson , Thank you for taking time to come for your Medicare Wellness Visit. I appreciate your ongoing commitment to your health goals. Please review the following plan we discussed and let me know if I can assist you in the future.   These are the goals we discussed: Goals    . DIET - INCREASE WATER INTAKE       This is a list of the screening recommended for you and due dates:  Health Maintenance  Topic Date Due  . Flu Shot  05/26/2019*  . Pneumonia vaccines (1 of 2 - PCV13) 05/26/2019*  . Colon Cancer Screening  06/29/2023  . Tetanus Vaccine  02/18/2025  .  Hepatitis C: One time screening is recommended by Center for Disease Control  (CDC) for  adults born from [birth year] through 1963.   Completed  *Topic was postponed. The date shown is not the original due date.    Health Maintenance, Male A healthy lifestyle and preventive care is important for your health and wellness. Ask your health care provider about what schedule of regular examinations is right for you. What should I know about weight and diet? Eat a Healthy Diet  Eat plenty of vegetables, fruits, whole grains, low-fat dairy products, and lean protein.  Do not eat a lot of foods high in solid fats, added sugars, or salt.  Maintain a Healthy Weight Regular exercise can help you achieve or maintain a healthy weight. You should:  Do at least 150 minutes of exercise each week. The exercise should increase your heart rate and make you sweat (moderate-intensity exercise).  Do strength-training exercises at least twice a week.  Watch  Your Levels of Cholesterol and Blood Lipids  Have your blood tested for lipids and cholesterol every 5 years starting at 70 years of age. If you are at high risk for heart disease, you should start having your blood tested when you are 70 years old. You may need to have your cholesterol levels checked more often if: ? Your lipid or cholesterol levels are high. ? You are older than 70 years of age. ? You are at high risk for heart disease.  What should I know about cancer screening? Many types of cancers can be detected early and may often be prevented. Lung Cancer  You should be screened every year for lung cancer if: ? You are a current smoker who has smoked for at least 30 years. ? You are a former smoker who has quit within the past 15 years.  Talk to your health care provider about your screening options, when you should start screening, and how often you should be screened.  Colorectal Cancer  Routine colorectal cancer screening usually begins at 70 years of age and should be repeated every 5-10 years until you are 70 years old. You may need to be screened more often if early forms of precancerous polyps or small growths are found. Your health care provider may recommend screening at an earlier age if you have risk factors for colon cancer.  Your health care provider may recommend using home test kits  to check for hidden blood in the stool.  A small camera at the end of a tube can be used to examine your colon (sigmoidoscopy or colonoscopy). This checks for the earliest forms of colorectal cancer.  Prostate and Testicular Cancer  Depending on your age and overall health, your health care provider may do certain tests to screen for prostate and testicular cancer.  Talk to your health care provider about any symptoms or concerns you have about testicular or prostate cancer.  Skin Cancer  Check your skin from head to toe regularly.  Tell your health care provider about any new  moles or changes in moles, especially if: ? There is a change in a mole's size, shape, or color. ? You have a mole that is larger than a pencil eraser.  Always use sunscreen. Apply sunscreen liberally and repeat throughout the day.  Protect yourself by wearing long sleeves, pants, a wide-brimmed hat, and sunglasses when outside.  What should I know about heart disease, diabetes, and high blood pressure?  If you are 35-47 years of age, have your blood pressure checked every 3-5 years. If you are 70 years of age or older, have your blood pressure checked every year. You should have your blood pressure measured twice-once when you are at a hospital or clinic, and once when you are not at a hospital or clinic. Record the average of the two measurements. To check your blood pressure when you are not at a hospital or clinic, you can use: ? An automated blood pressure machine at a pharmacy. ? A home blood pressure monitor.  Talk to your health care provider about your target blood pressure.  If you are between 70-64 years old, ask your health care provider if you should take aspirin to prevent heart disease.  Have regular diabetes screenings by checking your fasting blood sugar level. ? If you are at a normal weight and have a low risk for diabetes, have this test once every three years after the age of 70. ? If you are overweight and have a high risk for diabetes, consider being tested at 70 younger age or more often.  A one-time screening for abdominal aortic aneurysm (AAA) by ultrasound is recommended for men aged 75-75 years who are current or former smokers. What should I know about preventing infection? Hepatitis B If you have a higher risk for hepatitis B, you should be screened for this virus. Talk with your health care provider to find out if you are at risk for hepatitis B infection. Hepatitis C Blood testing is recommended for:  Everyone born from [birth year] through 1963.  Anyone with  known risk factors for hepatitis C.  Sexually Transmitted Diseases (STDs)  You should be screened each year for STDs including gonorrhea and chlamydia if: ? You are sexually active and are younger than 70 years of age. ? You are older than 70 years of age and your health care provider tells you that you are at risk for this type of infection. ? Your sexual activity has changed since you were last screened and you are at an increased risk for chlamydia or gonorrhea. Ask your health care provider if you are at risk.  Talk with your health care provider about whether you are at high risk of being infected with HIV. Your health care provider may recommend a prescription medicine to help prevent HIV infection.  What else can I do?  Schedule regular health, dental, and eye exams.  Stay  current with your vaccines (immunizations).  Do not use any tobacco products, such as cigarettes, chewing tobacco, and e-cigarettes. If you need help quitting, ask your health care provider.  Limit alcohol intake to no more than 2 drinks per day. One drink equals 12 ounces of beer, 5 ounces of wine, or 1 ounces of hard liquor.  Do not use street drugs.  Do not share needles.  Ask your health care provider for help if you need support or information about quitting drugs.  Tell your health care provider if you often feel depressed.  Tell your health care provider if you have ever been abused or do not feel safe at home. This information is not intended to replace advice given to you by your health care provider. Make sure you discuss any questions you have with your health care provider. Document Released: 12/25/2007 Document Revised: 02/25/2016 Document Reviewed: 04/01/2015 Elsevier Interactive Patient Education  [employer].

## 2018-05-26 ENCOUNTER — Encounter: Payer: Self-pay | Admitting: Family Medicine

## 2018-06-01 ENCOUNTER — Encounter: Payer: Self-pay | Admitting: Family Medicine

## 2018-08-17 ENCOUNTER — Other Ambulatory Visit: Payer: Self-pay | Admitting: Family Medicine

## 2018-08-17 DIAGNOSIS — M5010 Cervical disc disorder with radiculopathy, unspecified cervical region: Secondary | ICD-10-CM

## 2018-09-07 ENCOUNTER — Ambulatory Visit (INDEPENDENT_AMBULATORY_CARE_PROVIDER_SITE_OTHER): Payer: Medicare Other | Admitting: Family Medicine

## 2018-09-07 ENCOUNTER — Encounter: Payer: Self-pay | Admitting: Family Medicine

## 2018-09-07 VITALS — BP 110/62 | HR 62 | Resp 12 | Ht 67.0 in | Wt 151.0 lb

## 2018-09-07 DIAGNOSIS — S0501XA Injury of conjunctiva and corneal abrasion without foreign body, right eye, initial encounter: Secondary | ICD-10-CM | POA: Diagnosis not present

## 2018-09-07 MED ORDER — MOXIFLOXACIN HCL 0.5 % OP SOLN: 1.0000 [drp] | Freq: Three times a day (TID) | OPHTHALMIC | 0 refills | Status: DC

## 2018-09-07 NOTE — Progress Notes (Signed)
Patient ID: Paul Carlson, male    DOB: 1945-10-28  Age: 73 y.o. MRN: [id number]    Subjective:  Subjective  HPI DEIGO ALONSO presents for pain in r eye since this am.  He feels like there is something in his eye but it does feel better now.    Review of Systems  Constitutional: Negative for appetite change, diaphoresis, fatigue and unexpected weight change.  Eyes: Positive for pain and redness. Negative for visual disturbance.  Respiratory: Negative for cough, chest tightness, shortness of breath and wheezing.   Cardiovascular: Negative for chest pain, palpitations and leg swelling.  Endocrine: Negative for cold intolerance, heat intolerance, polydipsia, polyphagia and polyuria.  Genitourinary: Negative for difficulty urinating, dysuria and frequency.  Neurological: Negative for dizziness, light-headedness, numbness and headaches.    History Past Medical History:  Diagnosis Date  . Borderline hypertension   . Cervical radiculopathy   . Frequent headaches   . GERD (gastroesophageal reflux disease)   . Migraines     He has a past surgical history that includes Hernia repair and Lithotripsy.   His family history includes Alzheimer's disease in his father; Arthritis in his father; Cancer in his paternal grandfather; Hypertension in his father; Kidney disease in his father.He reports that he has never smoked. He has never used smokeless tobacco. He reports that he does not drink alcohol. No history on file for drug.  Current Outpatient Medications on File Prior to Visit  Medication Sig Dispense Refill  . acetaminophen (TYLENOL) 650 MG CR tablet Take 650 mg by mouth at bedtime as needed for pain.    Marland Kitchen aspirin-acetaminophen-caffeine (EXCEDRIN MIGRAINE) 250-250-65 MG tablet Take 1-2 tablets by mouth as needed    . cetirizine (ZYRTEC ALLERGY) 10 MG tablet Take 1 tablet (10 mg total) by mouth daily. 30 tablet 2  . fluticasone (FLONASE) 50 MCG/ACT nasal spray Place 2 sprays into both  nostrils daily. 16 g 2  . gabapentin (NEURONTIN) 400 MG capsule TAKE 3 CAPSULES BY MOUTH EVERY DAY AT BEDTIME 270 capsule 1  . ibuprofen (ADVIL,MOTRIN) 200 MG tablet Take 600 mg by mouth as needed.    Marland Kitchen lisinopril (PRINIVIL,ZESTRIL) 5 MG tablet Take 1 tablet (5 mg total) by mouth daily. 30 tablet 11   No current facility-administered medications on file prior to visit.      Objective:  Objective  Physical Exam Vitals signs and nursing note reviewed.  Constitutional:      General: He is sleeping.     Appearance: He is well-developed.  HENT:     Head: Normocephalic and atraumatic.  Eyes:     General: Lids are normal. Lids are everted, no foreign bodies appreciated. Vision grossly intact. Gaze aligned appropriately.        Right eye: No foreign body, discharge or hordeolum.        Left eye: No foreign body, discharge or hordeolum.     Extraocular Movements: Extraocular movements intact.     Pupils: Pupils are equal, round, and reactive to light.   Neck:     Musculoskeletal: Normal range of motion and neck supple.     Thyroid: No thyromegaly.  Cardiovascular:     Rate and Rhythm: Normal rate and regular rhythm.     Heart sounds: No murmur.  Pulmonary:     Effort: Pulmonary effort is normal. No respiratory distress.     Breath sounds: Normal breath sounds. No wheezing or rales.  Chest:     Chest wall: No tenderness.  Musculoskeletal:        General: No tenderness.  Skin:    General: Skin is warm and dry.  Neurological:     Mental Status: He is oriented to person, place, and time.  Psychiatric:        Behavior: Behavior normal.        Thought Content: Thought content normal.        Judgment: Judgment normal.    BP 110/62   Pulse 62   Resp 12   Ht 5\' 7"  (1.702 m)   Wt 151 lb (68.5 kg)   SpO2 98%   BMI 23.65 kg/m  Wt Readings from Last 3 Encounters:  09/07/18 151 lb (68.5 kg)  05/25/18 150 lb 3.2 oz (68.1 kg)  08/24/17 156 lb 3.2 oz (70.9 kg)     Lab Results    Component Value Date   WBC 9.2 08/29/2017   HGB 14.8 08/29/2017   HCT 44.0 08/29/2017   PLT 208.0 08/29/2017   GLUCOSE 90 08/29/2017   CHOL 199 05/25/2018   TRIG 93.0 05/25/2018   HDL 51.40 05/25/2018   LDLCALC 129 (H) 05/25/2018   ALT 13 08/29/2017   AST 12 08/29/2017   NA 140 08/29/2017   K 4.6 08/29/2017   CL 105 08/29/2017   CREATININE 1.07 08/29/2017   BUN 16 08/29/2017   CO2 30 08/29/2017   PSA 1.31 08/29/2017    Patient was never admitted.   Assessment & Plan:  Plan  I am having Walden Statz. Crill start on moxifloxacin. I am also having him maintain his acetaminophen, ibuprofen, aspirin-acetaminophen-caffeine, cetirizine, fluticasone, lisinopril, and gabapentin.  Meds ordered this encounter  Medications  . moxifloxacin (VIGAMOX) 0.5 % ophthalmic solution    Sig: Place 1 drop into the right eye 3 (three) times daily.    Dispense:  3 mL    Refill:  0    Problem List Items Addressed This Visit    None    Visit Diagnoses    Abrasion of right cornea, initial encounter    -  Primary   Relevant Medications   moxifloxacin (VIGAMOX) 0.5 % ophthalmic solution    cool compresses-- patch if needed  If no better in 2-3 days see opth  Follow-up: Return in about 4 weeks (around 10/05/2018), or if symptoms worsen or fail to improve, for annual exam, fasting.  Ann Held, DO

## 2018-09-07 NOTE — Patient Instructions (Signed)
Corneal Abrasion    A corneal abrasion is a scratch or injury to the clear covering over the front of your eye (cornea). Your cornea forms a clear dome that protects your eye and helps to focus your vision. Your cornea is made up of many layers. The surface layer is a single layer of cells (corneal epithelium). It is one of the most sensitive tissues in your body. A corneal abrasion can be very painful.  If a corneal abrasion is not treated, it can become infected and cause an ulcer. This can lead to scarring. A scarred cornea can affect your vision. Sometimes abrasions come back in the same area, even after the original injury has healed (recurrent erosion syndrome).  What are the causes?  This condition may be caused by:  · A poke in the eye.  · A gritty or irritating substance (foreign body) in the eye.  · Excessive eye rubbing.  · Very dry eyes.  · Certain eye infections.  · Contact lenses that fit poorly or are worn for a long period of time. You can also injure your cornea when putting contacts lenses in your eye or taking them out.  · Eye surgery.  Sometimes, the cause is unknown.  What are the signs or symptoms?  Symptoms of this condition include:  · Eye pain. The pain may get worse when your eye is open or when you move your eye.  · A feeling of something stuck in your eye.  · Having trouble keeping your eye open, or not being able to keep it open.  · Tearing and redness.  · Sensitivity to light.  · Blurred vision.  · Headache.  How is this diagnosed?  This condition may be diagnosed based on:  · Your medical history.  · Your symptoms.  · An eye exam. You may work with a health care provider who specializes in diseases and conditions of the eye (ophthalmologist). Before the eye exam, numbing drops may be put into your eye. You may also have dye put in your eye with a dropper or a small paper strip. The dye makes the abrasion easy to see when your ophthalmologist examines your eye with a light. Your  ophthalmologist may look at your eye through an eye scope (slit lamp).  How is this treated?  Treatment may vary depending on the cause of your condition, and it may include:  · Washing out your eye.  · Removing any foreign body.  · Antibiotic drops or ointment to treat an infection.  · Steroid drops or ointment to treat redness, irritation, or inflammation.  · Pain medicine.  · An eye patch to keep your eye closed.  Follow these instructions at home:  Medicines  · Use eye drops or ointments as told by your eye care provider.  · If you were prescribed antibiotic drops or ointment, use them as told by your eye care provider. Do not stop using the antibiotic even if you start to feel better.  · Take over-the-counter and prescription medicines only as told by your eye care provider.  · Do not drive or use heavy machinery while taking prescription pain medicine.  General instructions  · If you have an eye patch, wear it as told by your eye care provider.  ? Do not drive or use machinery while wearing an eye patch. Your ability to judge distances will be impaired.  ? Follow instructions from your eye care provider about when to remove the patch.  ·   Ask your eye care provider whether you can use a cold, wet cloth (compress) on your eye to relieve pain.  · Do not rub or touch your eye. Do not wash out your eye.  · Do not wear contact lenses until your eye care provider says that this is okay.  · Avoid bright light and eye strain.  · Keep all follow-up visits as told by your eye care provider. This is important for preventing infection and vision loss.  Contact a health care provider if:  · You continue to have eye pain and other symptoms for more than 2 days.  · You develop new symptoms, such as redness, tearing, or discharge.  · You have discharge that makes your eyelids stick together in the morning.  · Your eye patch becomes so loose that you can blink your eye.  · Symptoms return after the original abrasion has  healed.  Get help right away if:  · You have severe eye pain that does not get better with medicine.  · You have vision loss.  Summary  · A corneal abrasion is a scratch on the outer layer of the clear covering over the front of your eye (cornea).  · Corneal abrasion can cause eye pain, redness, tearing, and blurred vision.  · This condition is usually treated with medicine to prevent infection and scarring. You also may have to wear an eye patch to cover your eye.  · Let your eye care provider know if your symptoms continue for more than 2 days.  This information is not intended to replace advice given to you by your health care provider. Make sure you discuss any questions you have with your health care provider.  Document Released: 06/25/2000 Document Revised: 06/08/2016 Document Reviewed: 06/08/2016  Elsevier Interactive Patient Education © 2019 Elsevier Inc.

## 2018-10-11 ENCOUNTER — Encounter: Payer: Medicare Other | Admitting: Family Medicine

## 2018-10-13 ENCOUNTER — Ambulatory Visit: Payer: Self-pay

## 2018-10-13 NOTE — Telephone Encounter (Signed)
Patient called and says he's been having chest pain for the past several day across his lower chest, not radiating. He says when he takes a deep breath, he feels it more and it's a sharper pain at a 4, but otherwise a 1. He says he is not having SOB, no cough, no fever. I asked about chest injury, he denies, but says in the last couple of weeks he's built a porch, yesterday he mowed about an acre or more of grass, he's been putting the mower up on a lift and says he's having to lift an iron rod to get the mower up. He says he was not really feeling it until he was washing 2 cars this morning. I asked is it an effort to breathe, he says no, he's walking and talking to me and no SOB or the feeling to take deep breaths. He says he takes it to see if it will relieve the discomfort he's feeling. He says it's almost like he needs to burp, but then again he doesn't know. I placed him on hold and called the office, spoke to the [facility] who asked me to hold while she check with a provider. She says Dr. Carollee Herter advises to go to the UC for an EKG, just to rule out anything. I advised the patient, he verbalized understanding.   Reason for Disposition . Taking a deep breath makes pain worse  Answer Assessment - Initial Assessment Questions 1. LOCATION: "Where does it hurt?"       Across lower chest 2. RADIATION: "Does the pain go anywhere else?" (e.g., into neck, jaw, arms, back)     No 3. ONSET: "When did the chest pain begin?" (Minutes, hours or days)      Several days ago mostly all week, getting worse  4. PATTERN "Does the pain come and go, or has it been constant since it started?"  "Does it get worse with exertion?"      Always there, but gets worse when take a deep breath 5. DURATION: "How long does it last" (e.g., seconds, minutes, hours)     Constant all the time 6. SEVERITY: "How bad is the pain?"  (e.g., Scale 1-10; mild, moderate, or severe)    - MILD (1-3): doesn't interfere with normal activities      - MODERATE (4-7): interferes with normal activities or awakens from sleep    - SEVERE (8-10): excruciating pain, unable to do any normal activities       4 when take a deep breath; 1 when not taking a deep breath 7. CARDIAC RISK FACTORS: "Do you have any history of heart problems or risk factors for heart disease?" (e.g., prior heart attack, angina; high blood pressure, diabetes, being overweight, high cholesterol, smoking, or strong family history of heart disease)     HTN 8. PULMONARY RISK FACTORS: "Do you have any history of lung disease?"  (e.g., blood clots in lung, asthma, emphysema, birth control pills)     No 9. CAUSE: "What do you think is causing the chest pain?"     I don't know 10. OTHER SYMPTOMS: "Do you have any other symptoms?" (e.g., dizziness, nausea, vomiting, sweating, fever, difficulty breathing, cough)      Allergy cough for sometime 11. PREGNANCY: "Is there any chance you are pregnant?" "When was your last menstrual period?"      N/A  Protocols used: CHEST PAIN-A-AH

## 2018-10-16 DIAGNOSIS — I10 Essential (primary) hypertension: Secondary | ICD-10-CM | POA: Diagnosis not present

## 2018-10-16 DIAGNOSIS — R509 Fever, unspecified: Secondary | ICD-10-CM | POA: Diagnosis not present

## 2018-10-16 DIAGNOSIS — R791 Abnormal coagulation profile: Secondary | ICD-10-CM | POA: Diagnosis not present

## 2018-10-16 DIAGNOSIS — R0602 Shortness of breath: Secondary | ICD-10-CM | POA: Diagnosis not present

## 2018-10-16 DIAGNOSIS — E785 Hyperlipidemia, unspecified: Secondary | ICD-10-CM | POA: Diagnosis not present

## 2018-10-16 DIAGNOSIS — R079 Chest pain, unspecified: Secondary | ICD-10-CM | POA: Diagnosis not present

## 2018-10-16 DIAGNOSIS — R0902 Hypoxemia: Secondary | ICD-10-CM | POA: Diagnosis not present

## 2018-10-16 DIAGNOSIS — R05 Cough: Secondary | ICD-10-CM | POA: Diagnosis not present

## 2018-10-17 DIAGNOSIS — I10 Essential (primary) hypertension: Secondary | ICD-10-CM | POA: Diagnosis not present

## 2018-10-17 DIAGNOSIS — R05 Cough: Secondary | ICD-10-CM | POA: Diagnosis not present

## 2018-10-17 DIAGNOSIS — R0902 Hypoxemia: Secondary | ICD-10-CM | POA: Insufficient documentation

## 2018-10-17 DIAGNOSIS — R079 Chest pain, unspecified: Secondary | ICD-10-CM | POA: Diagnosis not present

## 2018-10-17 DIAGNOSIS — R509 Fever, unspecified: Secondary | ICD-10-CM | POA: Diagnosis not present

## 2018-10-18 ENCOUNTER — Telehealth: Payer: Self-pay

## 2018-10-18 NOTE — Telephone Encounter (Signed)
Patient called in to [facility] on 10/16/18. Received notification on 10/17/18. Called patient to see if he was seen in [facility] or if his symptoms were better. Left message for return call. States he had chest discomfort and fever of 99.2. Patient was advised on 10/13/18 to go to ED for evaluation.

## 2018-10-18 NOTE — Telephone Encounter (Signed)
Patietn called back states he is better now. Went to [facility] and had tests run. States they did not do COVID-19 but they did Quarantine he and his wife for 14 days.

## 2018-11-13 ENCOUNTER — Encounter: Payer: Self-pay | Admitting: Family Medicine

## 2018-11-14 NOTE — Progress Notes (Signed)
[place] [facility] [address], [place], [place] [address] [phone]  Date:  11/15/2018   Name:  Paul Carlson   DOB:  May 02, 1946   MRN:  [id number]  PCP:  Darreld Mclean, MD    Chief Complaint: No chief complaint on file.   History of Present Illness:  Paul Carlson is a 73 y.o. 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  Shingrix- encouraged him to have this done at the drug store Colonoscopy: 2019, recheck 3 years- due 2022 Labs: February 2019, repeat cholesterol November.  He does do PSA testing  He was in the ER a few weeks ago with a likely viral infection- [facility].  He was evaluated in the ER and he reports that they wanted to admit him.  However he ended up going home- he was not tested for COVID as his temp was not that high  He did self- quarantine for 14 days to be on the safe side He seems to have recovered fully from this illness Patient Active Problem List   Diagnosis Date Noted  . History of hyperlipidemia 02/23/2016  . Essential hypertension 02/23/2016  . Cervical disc disorder with radiculopathy of cervical region 02/23/2016  .  Migraine with aura and without status migrainosus, not intractable 02/19/2015   Past Medical History:  Diagnosis Date  . Borderline hypertension   . Cervical radiculopathy   . Frequent headaches   . GERD (gastroesophageal reflux disease)   . Migraines     Past Surgical History:  Procedure Laterality Date  . HERNIA REPAIR    . LITHOTRIPSY      Social History   Tobacco Use  . Smoking status: Never Smoker  . Smokeless tobacco: Never Used  Substance Use Topics  . Alcohol use: No  . Drug use: Not on file    Family History  Problem Relation Age of Onset  . Alzheimer's disease Father   . Hypertension Father   . Kidney disease Father   . Arthritis Father   . Cancer Paternal Grandfather        Prostate Cancer  . Alcohol abuse Neg Hx     Allergies  Allergen Reactions  . Azithromycin Other (See Comments)    Pt. reported that it gave him severe stomach cramps.  . Pollen Extract Other (See Comments)    Pt. reports nasal congestion  . Penicillins Rash    Medication list has been reviewed and updated.  Current Outpatient Medications on File Prior to Visit  Medication Sig Dispense Refill  . acetaminophen (TYLENOL) 650 MG CR tablet Take 650 mg  by mouth at bedtime as needed for pain.    Marland Kitchen aspirin-acetaminophen-caffeine (EXCEDRIN MIGRAINE) 250-250-65 MG tablet Take 1-2 tablets by mouth as needed    . cetirizine (ZYRTEC ALLERGY) 10 MG tablet Take 1 tablet (10 mg total) by mouth daily. 30 tablet 2  . fluticasone (FLONASE) 50 MCG/ACT nasal spray Place 2 sprays into both nostrils daily. 16 g 2  . gabapentin (NEURONTIN) 400 MG capsule TAKE 3 CAPSULES BY MOUTH EVERY DAY AT BEDTIME 270 capsule 1  . ibuprofen (ADVIL,MOTRIN) 200 MG tablet Take 600 mg by mouth as needed.    Marland Kitchen lisinopril (PRINIVIL,ZESTRIL) 5 MG tablet Take 1 tablet (5 mg total) by mouth daily. 30 tablet 11  . moxifloxacin (VIGAMOX) 0.5 % ophthalmic solution Place 1 drop into the right eye 3 (three) times daily. 3 mL 0    No current facility-administered medications on file prior to visit.     Review of Systems:  As per HPI- otherwise negative. No fever chills, no chest pain or shortness of breath  Physical Examination: There were no vitals filed for this visit. There were no vitals filed for this visit. There is no height or weight on file to calculate BMI. Ideal Body Weight:    Spoke with pt on the phone He sounds well, no cough, wheezing or distress noted  He did check his BP this am- it was 146/78, pule was 58.  Admits that he does sometimes forget to take his BP med -"a 30-day prescription my last me 2 months" BP at ER visit was 126/78, pulse 61  BP Readings from Last 3 Encounters:  09/07/18 110/62  05/25/18 (!) 142/78  08/24/17 (!) 144/84     Assessment and Plan: Physical exam  Essential hypertension - Plan: lisinopril (ZESTRIL) 5 MG tablet, CBC, Comprehensive metabolic panel  Screening for prostate cancer - Plan: PSA  History of hyperlipidemia - Plan: Lipid panel  Screening for diabetes mellitus - Plan: Comprehensive metabolic panel, Hemoglobin A1c  CPE today- virtual He is doing fine BP sometimes runs a bit high, but admits that he does not always take his BP meds like he should Reminded that he will be due for a colonoscopy in 2022 He plans to come in for a flu shot and pneumonia shot this fall I ordered routine labs for him to have done at his convenience-asked my nurse to call them and set up a lab appointment Spoke with pt for 17:37  The following instructions, and also age-appropriate health maintenance instructions, sent to patient's MyChart account  It was great to talk with you today!   It looks like your colonoscopy is due in 2022  My nurse should contact you to set up a lab visit.  If you do not hear from [place], certainly give [place] a call and we will arrange this for you As we discussed, let us do a nurse visit this fall for a flu shot and pneumonia vaccine I would  also recommend that you get your shingles vaccine, this should be given at your drugstore.  Medicare does not cover this immunization if given at [place] office  Take care, let me know if you need anything Signed Lamar Blinks, MD

## 2018-11-15 ENCOUNTER — Ambulatory Visit (INDEPENDENT_AMBULATORY_CARE_PROVIDER_SITE_OTHER): Payer: Medicare Other | Admitting: Family Medicine

## 2018-11-15 ENCOUNTER — Other Ambulatory Visit: Payer: Self-pay

## 2018-11-15 ENCOUNTER — Encounter: Payer: Self-pay | Admitting: Family Medicine

## 2018-11-15 DIAGNOSIS — Z8639 Personal history of other endocrine, nutritional and metabolic disease: Secondary | ICD-10-CM | POA: Diagnosis not present

## 2018-11-15 DIAGNOSIS — Z131 Encounter for screening for diabetes mellitus: Secondary | ICD-10-CM | POA: Diagnosis not present

## 2018-11-15 DIAGNOSIS — Z125 Encounter for screening for malignant neoplasm of prostate: Secondary | ICD-10-CM

## 2018-11-15 DIAGNOSIS — I10 Essential (primary) hypertension: Secondary | ICD-10-CM | POA: Diagnosis not present

## 2018-11-15 DIAGNOSIS — Z0000 Encounter for general adult medical examination without abnormal findings: Secondary | ICD-10-CM | POA: Diagnosis not present

## 2018-11-15 MED ORDER — LISINOPRIL 5 MG PO TABS: 5.0000 mg | ORAL_TABLET | Freq: Every day | ORAL | 3 refills | Status: DC

## 2018-11-15 NOTE — Patient Instructions (Addendum)
It was great to talk with you today!   It looks like your colonoscopy is due in 2022  My nurse should contact you to set up a lab visit.  If you do not hear from [place], certainly give [place] a call and we will arrange this for you As we discussed, let us do a nurse visit this fall for a flu shot and pneumonia vaccine I would also recommend that you get your shingles vaccine, this should be given at your drugstore.  Medicare does not cover this immunization if given at [place] office  Take care, let me know if you need anything

## 2018-11-17 ENCOUNTER — Other Ambulatory Visit (INDEPENDENT_AMBULATORY_CARE_PROVIDER_SITE_OTHER): Payer: Medicare Other

## 2018-11-17 ENCOUNTER — Other Ambulatory Visit: Payer: Self-pay

## 2018-11-17 ENCOUNTER — Encounter: Payer: Self-pay | Admitting: Family Medicine

## 2018-11-17 DIAGNOSIS — Z131 Encounter for screening for diabetes mellitus: Secondary | ICD-10-CM

## 2018-11-17 DIAGNOSIS — Z125 Encounter for screening for malignant neoplasm of prostate: Secondary | ICD-10-CM

## 2018-11-17 DIAGNOSIS — I10 Essential (primary) hypertension: Secondary | ICD-10-CM | POA: Diagnosis not present

## 2018-11-17 DIAGNOSIS — Z8639 Personal history of other endocrine, nutritional and metabolic disease: Secondary | ICD-10-CM

## 2018-11-17 LAB — CBC
HCT: 43.6 % (ref 39.0–52.0)
Hemoglobin: 14.5 g/dL (ref 13.0–17.0)
MCHC: 33.4 g/dL (ref 30.0–36.0)
MCV: 93.4 fl (ref 78.0–100.0)
Platelets: 224 10*3/uL (ref 150.0–400.0)
RBC: 4.66 Mil/uL (ref 4.22–5.81)
RDW: 13.7 % (ref 11.5–15.5)
WBC: 5.3 10*3/uL (ref 4.0–10.5)

## 2018-11-17 LAB — COMPREHENSIVE METABOLIC PANEL
ALT: 13 U/L (ref 0–53)
AST: 17 U/L (ref 0–37)
Albumin: 4.3 g/dL (ref 3.5–5.2)
Alkaline Phosphatase: 67 U/L (ref 39–117)
BUN: 20 mg/dL (ref 6–23)
CO2: 30 mEq/L (ref 19–32)
Calcium: 9.8 mg/dL (ref 8.4–10.5)
Chloride: 104 mEq/L (ref 96–112)
Creatinine, Ser: 1.11 mg/dL (ref 0.40–1.50)
GFR: 65.34 mL/min (ref >60.00)
Glucose, Bld: 95 mg/dL (ref 70–99)
Potassium: 5.7 mEq/L — ABNORMAL HIGH (ref 3.5–5.1)
Sodium: 141 mEq/L (ref 135–145)
Total Bilirubin: 0.7 mg/dL (ref 0.2–1.2)
Total Protein: 6.9 g/dL (ref 6.0–8.3)

## 2018-11-17 LAB — LIPID PANEL
Cholesterol: 180 mg/dL (ref 0–200)
HDL: 42.1 mg/dL (ref >39.00)
LDL Cholesterol: 119 mg/dL — ABNORMAL HIGH (ref 0–99)
NonHDL: 137.57
Total CHOL/HDL Ratio: 4
Triglycerides: 92 mg/dL (ref 0.0–149.0)
VLDL: 18.4 mg/dL (ref 0.0–40.0)

## 2018-11-17 LAB — HEMOGLOBIN A1C: Hgb A1c MFr Bld: 5.8 % (ref 4.6–6.5)

## 2018-11-17 LAB — PSA: PSA: 1.38 ng/mL (ref 0.10–4.00)

## 2018-11-20 ENCOUNTER — Other Ambulatory Visit: Payer: Self-pay

## 2018-11-20 ENCOUNTER — Other Ambulatory Visit: Payer: Self-pay | Admitting: Family Medicine

## 2018-11-20 ENCOUNTER — Other Ambulatory Visit (INDEPENDENT_AMBULATORY_CARE_PROVIDER_SITE_OTHER): Payer: Medicare Other

## 2018-11-20 DIAGNOSIS — E875 Hyperkalemia: Secondary | ICD-10-CM | POA: Diagnosis not present

## 2018-11-20 NOTE — Progress Notes (Signed)
po

## 2018-11-21 LAB — POTASSIUM: Potassium: 4.5 mEq/L (ref 3.5–5.1)

## 2019-02-17 ENCOUNTER — Other Ambulatory Visit: Payer: Self-pay | Admitting: Family Medicine

## 2019-02-17 DIAGNOSIS — M5010 Cervical disc disorder with radiculopathy, unspecified cervical region: Secondary | ICD-10-CM

## 2019-03-05 ENCOUNTER — Telehealth: Payer: Self-pay | Admitting: *Deleted

## 2019-03-05 DIAGNOSIS — Z20828 Contact with and (suspected) exposure to other viral communicable diseases: Secondary | ICD-10-CM

## 2019-03-05 DIAGNOSIS — Z20822 Contact with and (suspected) exposure to covid-19: Secondary | ICD-10-CM

## 2019-03-05 NOTE — Telephone Encounter (Signed)
Who Is Calling Patient / Member / Family / Caregiver Call Type Triage / Clinical Relationship To Patient Self Return Phone Number [phone] (Secondary) Chief Complaint Cough Reason for Call Symptomatic / Request for Health Information Initial Comment Chart 1/2. 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 A member of the church tested positive for COVID. He and his wife have similar symptoms the member who tested positive for COVID has. They also have allergy issues. He is having sinus drainage and cough he normally has. Translation No Nurse Assessment Nurse: London Pepper, RN, Jeneen Rinks Date/Time Eilene Ghazi Time): 03/03/2019 4:37:24 PM Confirm and document reason for call. If symptomatic, describe symptoms. 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 States no new symptoms, but does have a chronic cough, and sinus drainage that he has year round. Calling for precaution.

## 2019-03-05 NOTE — Telephone Encounter (Signed)
FYI  Left message on machine for patient to call back to let us know if he would like for [place] to send him for screening.

## 2019-03-05 NOTE — Telephone Encounter (Signed)
Paul Bruin do you mind helping me out with this?

## 2019-03-06 NOTE — Telephone Encounter (Signed)
Patient stated that they got in contact with a healthcare professional and since they only had minimal contact that they do not want testing at this time.  They will call back if needed.

## 2019-06-01 ENCOUNTER — Ambulatory Visit: Payer: Medicare Other | Admitting: *Deleted

## 2019-06-28 NOTE — Progress Notes (Signed)
Virtual Visit via Video Note  I connected with patient on 06/29/19 at 11:00 AM EST by audio enabled telemedicine application and verified that I am speaking with the correct person using two identifiers.   THIS ENCOUNTER IS A VIRTUAL VISIT DUE TO COVID-19 - PATIENT WAS NOT SEEN IN THE OFFICE. PATIENT HAS CONSENTED TO VIRTUAL VISIT / TELEMEDICINE VISIT   Location of patient: home  Location of provider: office  I discussed the limitations of evaluation and management by telemedicine and the availability of in person appointments. The patient expressed understanding and agreed to proceed.   Subjective:   Paul Carlson is a 73 y.o. male who presents for Medicare Annual/Subsequent preventive examination.  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 Pt enjoys camping w/ wife and 2 dogs.   Review of Systems:  Home Safety/Smoke Alarms: Feels safe in home. Smoke alarms in place.  Lives with wife and dogs in 1 story home.   Male:   CCS-  Pt reports last done [facility] 07/2017.   PSA-  Lab Results  Component Value Date   PSA 1.38 11/17/2018   PSA 1.31 08/29/2017   PSA 1.77 02/23/2016       Objective:    Vitals: Unable to assess. This visit is enabled though telemedicine due to Covid 19.   Advanced Directives 05/25/2018  Does Patient Have a Medical Advance Directive? No  Would patient like information on creating a medical advance directive? Yes (MAU/Ambulatory/Procedural Areas - Information given)    Tobacco Social History   Tobacco Use  Smoking Status Never Smoker  Smokeless Tobacco Never Used     Counseling given: Not Answered   Clinical Intake:     Pain : No/denies pain                 Past Medical History:  Diagnosis Date  . Borderline hypertension   . Cervical radiculopathy   . Frequent headaches   . GERD (gastroesophageal reflux disease)   . Migraines    Past Surgical History:  Procedure Laterality Date  .  HERNIA REPAIR    . LITHOTRIPSY     Family History  Problem Relation Age of Onset  . Alzheimer's disease Father   . Hypertension Father   . Kidney disease Father   . Arthritis Father   . Cancer Paternal Grandfather        Prostate Cancer  . Alcohol abuse Neg Hx    Social History   Socioeconomic History  . Marital status: Married    Spouse name: Not on file  . Number of children: Not on file  . Years of education: Not on file  . Highest education level: Not on file  Occupational History  . Occupation: Pastor  Tobacco Use  . Smoking status: Never Smoker  . Smokeless tobacco: Never Used  Substance and Sexual Activity  . Alcohol use: No  . Drug use: Not on file  . Sexual activity: Not on file  Other Topics Concern  . Not on file  Social History Narrative  . Not on file   Social Determinants of Health   Financial Resource Strain:   . Difficulty of Paying Living Expenses: Not on file  Food Insecurity:   . Worried About Charity fundraiser in the Last Year: Not on file  . Ran Out of Food in the Last Year: Not on file  Transportation Needs:   . Lack of Transportation (Medical): Not on  file  . Lack of Transportation (Non-Medical): Not on file  Physical Activity:   . Days of Exercise per Week: Not on file  . Minutes of Exercise per Session: Not on file  Stress:   . Feeling of Stress : Not on file  Social Connections:   . Frequency of Communication with Friends and Family: Not on file  . Frequency of Social Gatherings with Friends and Family: Not on file  . Attends Religious Services: Not on file  . Active Member of Clubs or Organizations: Not on file  . Attends Archivist Meetings: Not on file  . Marital Status: Not on file    Outpatient Encounter Medications as of 06/29/2019  Medication Sig  . acetaminophen (TYLENOL) 650 MG CR tablet Take 650 mg by mouth at bedtime as needed for pain.  Marland Kitchen aspirin-acetaminophen-caffeine (EXCEDRIN MIGRAINE) 250-250-65 MG  tablet Take 1-2 tablets by mouth as needed  . cetirizine (ZYRTEC ALLERGY) 10 MG tablet Take 1 tablet (10 mg total) by mouth daily.  . fluticasone (FLONASE) 50 MCG/ACT nasal spray Place 2 sprays into both nostrils daily.  Marland Kitchen gabapentin (NEURONTIN) 400 MG capsule TAKE 3 CAPSULES BY MOUTH EVERY DAY AT BEDTIME  . ibuprofen (ADVIL,MOTRIN) 200 MG tablet Take 600 mg by mouth as needed.  Marland Kitchen lisinopril (ZESTRIL) 5 MG tablet Take 1 tablet (5 mg total) by mouth daily.   No facility-administered encounter medications on file as of 06/29/2019.    Activities of Daily Living No flowsheet data found.  Patient Care Team: Copland, Gay Filler, MD as PCP - General (Family Medicine)   Assessment:   This is a routine wellness examination for Mondre. Physical assessment deferred to PCP.  Exercise Activities and Dietary recommendations   Diet (meal preparation, eat out, water intake, caffeinated beverages, dairy products, fruits and vegetables): in general, an "unhealthy" diet Pt educated on healthier choices.  Goals    . DIET - INCREASE WATER INTAKE       Fall Risk Fall Risk  05/25/2018 08/24/2017  Falls in the past year? 1 No  Number falls in past yr: 0 -  Injury with Fall? 0 -    Depression Screen PHQ 2/9 Scores 05/25/2018 08/24/2017  PHQ - 2 Score 0 0    Cognitive Function Ad8 score reviewed for issues:  Issues making decisions:no  Less interest in hobbies / activities:no  Repeats questions, stories (family complaining):no  Trouble using ordinary gadgets (microwave, computer, phone):no  Forgets the month or year: no  Mismanaging finances: no  Remembering appts:no  Daily problems with thinking and/or memory:no Ad8 score is=0         Immunization History  Administered Date(s) Administered  . Tdap 02/19/2015   Screening Tests Health Maintenance  Topic Date Due  . PNA vac Low Risk Adult (1 of 2 - PCV13) 02/06/2013  . INFLUENZA VACCINE  02/10/2019  . COLONOSCOPY  11/08/2020    . TETANUS/TDAP  02/18/2025  . Hepatitis C Screening  Completed       Plan:   See you next year!  Continue to eat heart healthy diet (full of fruits, vegetables, whole grains, lean protein, water--limit salt, fat, and sugar intake) and increase physical activity as tolerated.  Continue doing brain stimulating activities (puzzles, reading, adult coloring books, staying active) to keep memory sharp.   Bring a copy of your living will and/or healthcare power of attorney to your next office visit.    I have personally reviewed and noted the following in the patient's  chart:   . Medical and social history . Use of alcohol, tobacco or illicit drugs  . Current medications and supplements . Functional ability and status . Nutritional status . Physical activity . Advanced directives . List of other physicians . Hospitalizations, surgeries, and ER visits in previous 12 months . Vitals . Screenings to include cognitive, depression, and falls . Referrals and appointments  In addition, I have reviewed and discussed with patient certain preventive protocols, quality metrics, and best practice recommendations. A written personalized care plan for preventive services as well as general preventive health recommendations were provided to patient.     Shela Nevin, [place]  06/29/2019

## 2019-06-29 ENCOUNTER — Ambulatory Visit (INDEPENDENT_AMBULATORY_CARE_PROVIDER_SITE_OTHER): Payer: Medicare Other | Admitting: *Deleted

## 2019-06-29 ENCOUNTER — Encounter: Payer: Self-pay | Admitting: *Deleted

## 2019-06-29 DIAGNOSIS — Z0000 Encounter for general adult medical examination without abnormal findings: Secondary | ICD-10-CM

## 2019-06-29 NOTE — Patient Instructions (Signed)
See you next year!  Continue to eat heart healthy diet (full of fruits, vegetables, whole grains, lean protein, water--limit salt, fat, and sugar intake) and increase physical activity as tolerated.  Continue doing brain stimulating activities (puzzles, reading, adult coloring books, staying active) to keep memory sharp.   Bring a copy of your living will and/or healthcare power of attorney to your next office visit.    Paul Carlson , Thank you for taking time to come for your Medicare Wellness Visit. I appreciate your ongoing commitment to your health goals. Please review the following plan we discussed and let me know if I can assist you in the future.   These are the goals we discussed: Goals    . DIET - INCREASE WATER INTAKE       This is a list of the screening recommended for you and due dates:  Health Maintenance  Topic Date Due  . Pneumonia vaccines (1 of 2 - PCV13) 02/06/2013  . Flu Shot  02/10/2019  . Colon Cancer Screening  11/08/2020  . Tetanus Vaccine  02/18/2025  .  Hepatitis C: One time screening is recommended by Center for Disease Control  (CDC) for  adults born from [birth year] through 1963.   Completed    Preventive Care 12 Years and Older, Male Preventive care refers to lifestyle choices and visits with your health care provider that can promote health and wellness. This includes:  A yearly physical exam. This is also called an annual well check.  Regular dental and eye exams.  Immunizations.  Screening for certain conditions.  Healthy lifestyle choices, such as diet and exercise. What can I expect for my preventive care visit? Physical exam Your health care provider will check:  Height and weight. These may be used to calculate body mass index (BMI), which is a measurement that tells if you are at a healthy weight.  Heart rate and blood pressure.  Your skin for abnormal spots. Counseling Your health care provider may ask you questions about:  Alcohol,  tobacco, and drug use.  Emotional well-being.  Home and relationship well-being.  Sexual activity.  Eating habits.  History of falls.  Memory and ability to understand (cognition).  Work and work Statistician. What immunizations do I need?  Influenza (flu) vaccine  This is recommended every year. Tetanus, diphtheria, and pertussis (Tdap) vaccine  You may need a Td booster every 10 years. Varicella (chickenpox) vaccine  You may need this vaccine if you have not already been vaccinated. Zoster (shingles) vaccine  You may need this after age 25. Pneumococcal conjugate (PCV13) vaccine  One dose is recommended after age 47. Pneumococcal polysaccharide (PPSV23) vaccine  One dose is recommended after age 44. Measles, mumps, and rubella (MMR) vaccine  You may need at least one dose of MMR if you were born in 1957 or later. You may also need a second dose. Meningococcal conjugate (MenACWY) vaccine  You may need this if you have certain conditions. Hepatitis A vaccine  You may need this if you have certain conditions or if you travel or work in places where you may be exposed to hepatitis A. Hepatitis B vaccine  You may need this if you have certain conditions or if you travel or work in places where you may be exposed to hepatitis B. Haemophilus influenzae type b (Hib) vaccine  You may need this if you have certain conditions. You may receive vaccines as individual doses or as more than one vaccine together in one  shot (combination vaccines). Talk with your health care provider about the risks and benefits of combination vaccines. What tests do I need? Blood tests  Lipid and cholesterol levels. These may be checked every 5 years, or more frequently depending on your overall health.  Hepatitis C test.  Hepatitis B test. Screening  Lung cancer screening. You may have this screening every year starting at age 91 if you have a 30-pack-year history of smoking and  currently smoke or have quit within the past 15 years.  Colorectal cancer screening. All adults should have this screening starting at age 61 and continuing until age 84. Your health care provider may recommend screening at age 57 if you are at increased risk. You will have tests every 1-10 years, depending on your results and the type of screening test.  Prostate cancer screening. Recommendations will vary depending on your family history and other risks.  Diabetes screening. This is done by checking your blood sugar (glucose) after you have not eaten for a while (fasting). You may have this done every 1-3 years.  Abdominal aortic aneurysm (AAA) screening. You may need this if you are a current or former smoker.  Sexually transmitted disease (STD) testing. Follow these instructions at home: Eating and drinking  Eat a diet that includes fresh fruits and vegetables, whole grains, lean protein, and low-fat dairy products. Limit your intake of foods with high amounts of sugar, saturated fats, and salt.  Take vitamin and mineral supplements as recommended by your health care provider.  Do not drink alcohol if your health care provider tells you not to drink.  If you drink alcohol: ? Limit how much you have to 0-2 drinks a day. ? Be aware of how much alcohol is in your drink. In the [place] one drink equals one 12 oz bottle of beer (355 mL), one 5 oz glass of wine (148 mL), or one 1 oz glass of hard liquor (44 mL). Lifestyle  Take daily care of your teeth and gums.  Stay active. Exercise for at least 30 minutes on 5 or more days each week.  Do not use any products that contain nicotine or tobacco, such as cigarettes, e-cigarettes, and chewing tobacco. If you need help quitting, ask your health care provider.  If you are sexually active, practice safe sex. Use a condom or other form of protection to prevent STIs (sexually transmitted infections).  Talk with your health care provider about  taking a low-dose aspirin or statin. What's next?  Visit your health care provider once a year for a well check visit.  Ask your health care provider how often you should have your eyes and teeth checked.  Stay up to date on all vaccines. This information is not intended to replace advice given to you by your health care provider. Make sure you discuss any questions you have with your health care provider. Document Released: 07/25/2015 Document Revised: 06/22/2018 Document Reviewed: 06/22/2018 Elsevier Patient Education  2020 [employer].

## 2019-08-16 ENCOUNTER — Telehealth: Payer: Self-pay | Admitting: Family Medicine

## 2019-08-17 ENCOUNTER — Other Ambulatory Visit: Payer: Self-pay | Admitting: Family Medicine

## 2019-08-17 DIAGNOSIS — M5010 Cervical disc disorder with radiculopathy, unspecified cervical region: Secondary | ICD-10-CM

## 2019-10-25 DIAGNOSIS — H2513 Age-related nuclear cataract, bilateral: Secondary | ICD-10-CM | POA: Diagnosis not present

## 2019-10-28 DIAGNOSIS — R220 Localized swelling, mass and lump, head: Secondary | ICD-10-CM | POA: Diagnosis not present

## 2019-10-28 DIAGNOSIS — Z87891 Personal history of nicotine dependence: Secondary | ICD-10-CM | POA: Diagnosis not present

## 2019-10-28 DIAGNOSIS — Z8739 Personal history of other diseases of the musculoskeletal system and connective tissue: Secondary | ICD-10-CM | POA: Diagnosis not present

## 2019-10-28 DIAGNOSIS — H269 Unspecified cataract: Secondary | ICD-10-CM | POA: Diagnosis not present

## 2019-10-28 DIAGNOSIS — H5712 Ocular pain, left eye: Secondary | ICD-10-CM | POA: Diagnosis not present

## 2019-10-28 DIAGNOSIS — L03213 Periorbital cellulitis: Secondary | ICD-10-CM | POA: Diagnosis not present

## 2019-10-28 DIAGNOSIS — H01003 Unspecified blepharitis right eye, unspecified eyelid: Secondary | ICD-10-CM | POA: Diagnosis not present

## 2019-10-28 DIAGNOSIS — H04129 Dry eye syndrome of unspecified lacrimal gland: Secondary | ICD-10-CM | POA: Diagnosis not present

## 2019-10-28 DIAGNOSIS — Z8679 Personal history of other diseases of the circulatory system: Secondary | ICD-10-CM | POA: Diagnosis not present

## 2019-11-02 DIAGNOSIS — H0102B Squamous blepharitis left eye, upper and lower eyelids: Secondary | ICD-10-CM | POA: Diagnosis not present

## 2019-11-02 DIAGNOSIS — H2513 Age-related nuclear cataract, bilateral: Secondary | ICD-10-CM | POA: Diagnosis not present

## 2019-11-02 DIAGNOSIS — L03213 Periorbital cellulitis: Secondary | ICD-10-CM | POA: Diagnosis not present

## 2019-11-02 DIAGNOSIS — H0102A Squamous blepharitis right eye, upper and lower eyelids: Secondary | ICD-10-CM | POA: Diagnosis not present

## 2019-11-02 DIAGNOSIS — H01006 Unspecified blepharitis left eye, unspecified eyelid: Secondary | ICD-10-CM | POA: Diagnosis not present

## 2019-11-02 DIAGNOSIS — H04123 Dry eye syndrome of bilateral lacrimal glands: Secondary | ICD-10-CM | POA: Diagnosis not present

## 2019-11-02 DIAGNOSIS — H01003 Unspecified blepharitis right eye, unspecified eyelid: Secondary | ICD-10-CM | POA: Diagnosis not present

## 2020-01-23 ENCOUNTER — Other Ambulatory Visit: Payer: Self-pay | Admitting: Family Medicine

## 2020-01-23 DIAGNOSIS — I10 Essential (primary) hypertension: Secondary | ICD-10-CM

## 2020-02-14 ENCOUNTER — Other Ambulatory Visit: Payer: Self-pay

## 2020-02-14 ENCOUNTER — Other Ambulatory Visit: Payer: Self-pay | Admitting: Family Medicine

## 2020-02-14 DIAGNOSIS — M5010 Cervical disc disorder with radiculopathy, unspecified cervical region: Secondary | ICD-10-CM

## 2020-02-14 MED ORDER — GABAPENTIN 400 MG PO CAPS: ORAL_CAPSULE | ORAL | 0 refills | Status: DC

## 2020-04-29 ENCOUNTER — Ambulatory Visit (INDEPENDENT_AMBULATORY_CARE_PROVIDER_SITE_OTHER): Payer: Medicare Other | Admitting: Family Medicine

## 2020-04-29 ENCOUNTER — Ambulatory Visit: Payer: Medicare Other | Admitting: Family Medicine

## 2020-04-29 ENCOUNTER — Ambulatory Visit: Payer: Medicare Other | Admitting: Medical

## 2020-04-29 ENCOUNTER — Encounter: Payer: Self-pay | Admitting: Family Medicine

## 2020-04-29 ENCOUNTER — Other Ambulatory Visit: Payer: Self-pay

## 2020-04-29 VITALS — BP 118/68 | HR 90 | Temp 98.2°F | Resp 18 | Ht 67.0 in | Wt 150.4 lb

## 2020-04-29 DIAGNOSIS — K219 Gastro-esophageal reflux disease without esophagitis: Secondary | ICD-10-CM

## 2020-04-29 DIAGNOSIS — R8290 Unspecified abnormal findings in urine: Secondary | ICD-10-CM | POA: Diagnosis not present

## 2020-04-29 DIAGNOSIS — R1084 Generalized abdominal pain: Secondary | ICD-10-CM | POA: Insufficient documentation

## 2020-04-29 LAB — POC URINALSYSI DIPSTICK (AUTOMATED)
Bilirubin, UA: NEGATIVE
Glucose, UA: NEGATIVE
Ketones, UA: NEGATIVE
Leukocytes, UA: NEGATIVE
Nitrite, UA: NEGATIVE
Protein, UA: NEGATIVE
Spec Grav, UA: 1.02 (ref 1.010–1.025)
Urobilinogen, UA: 0.2 E.U./dL
pH, UA: 5.5 (ref 5.0–8.0)

## 2020-04-29 MED ORDER — PANTOPRAZOLE SODIUM 40 MG PO TBEC: 40.0000 mg | DELAYED_RELEASE_TABLET | Freq: Every day | ORAL | 3 refills | Status: DC

## 2020-04-29 MED ORDER — CIPROFLOXACIN HCL 500 MG PO TABS: 500.0000 mg | ORAL_TABLET | Freq: Two times a day (BID) | ORAL | 0 refills | Status: DC

## 2020-04-29 MED ORDER — METRONIDAZOLE 500 MG PO TABS: 500.0000 mg | ORAL_TABLET | Freq: Three times a day (TID) | ORAL | 0 refills | Status: DC

## 2020-04-29 NOTE — Patient Instructions (Signed)

## 2020-04-29 NOTE — Progress Notes (Signed)
Patient ID: Paul Carlson, male    DOB: 1945/12/02  Age: 73 y.o. MRN: [id number]    Subjective:  Subjective  HPI Paul Carlson presents for 1 week hx abd cramping associated with eating / drinking---- some soft cookies made it worse   He also c/o testicular pain with cough and bending   Cramping is in LLQ and across low abd ---  He is burping some as well   Review of Systems  Constitutional: Negative for appetite change, diaphoresis, fatigue and unexpected weight change.  Eyes: Negative for pain, redness and visual disturbance.  Respiratory: Negative for cough, chest tightness, shortness of breath and wheezing.   Cardiovascular: Negative for chest pain, palpitations and leg swelling.  Endocrine: Negative for cold intolerance, heat intolerance, polydipsia, polyphagia and polyuria.  Genitourinary: Negative for difficulty urinating, dysuria and frequency.  Neurological: Negative for dizziness, light-headedness, numbness and headaches.    History Past Medical History:  Diagnosis Date  . Borderline hypertension   . Cervical radiculopathy   . Frequent headaches   . GERD (gastroesophageal reflux disease)   . Migraines     He has a past surgical history that includes Hernia repair and Lithotripsy.   His family history includes Alzheimer's disease in his father; Arthritis in his father; Cancer in his paternal grandfather; Hypertension in his father; Kidney disease in his father.He reports that he has never smoked. He has never used smokeless tobacco. He reports that he does not drink alcohol. No history on file for drug use.  Current Outpatient Medications on File Prior to Visit  Medication Sig Dispense Refill  . acetaminophen (TYLENOL) 650 MG CR tablet Take 650 mg by mouth at bedtime as needed for pain.    Marland Kitchen aspirin-acetaminophen-caffeine (EXCEDRIN MIGRAINE) 250-250-65 MG tablet Take 1-2 tablets by mouth as needed    . gabapentin (NEURONTIN) 400 MG capsule Take 3 capsules by mouth  everyday at bedtime 270 capsule 0  . ibuprofen (ADVIL,MOTRIN) 200 MG tablet Take 600 mg by mouth as needed.    Marland Kitchen lisinopril (ZESTRIL) 5 MG tablet TAKE 1 TABLET BY MOUTH EVERY DAY 90 tablet 3  . cetirizine (ZYRTEC ALLERGY) 10 MG tablet Take 1 tablet (10 mg total) by mouth daily. (Patient not taking: Reported on 04/29/2020) 30 tablet 2  . fluticasone (FLONASE) 50 MCG/ACT nasal spray Place 2 sprays into both nostrils daily. (Patient not taking: Reported on 04/29/2020) 16 g 2   No current facility-administered medications on file prior to visit.     Objective:  Objective  Physical Exam Vitals and nursing note reviewed.  Constitutional:      General: He is sleeping.     Appearance: He is well-developed.  HENT:     Head: Normocephalic and atraumatic.  Eyes:     Pupils: Pupils are equal, round, and reactive to light.  Neck:     Thyroid: No thyromegaly.  Cardiovascular:     Rate and Rhythm: Normal rate and regular rhythm.     Heart sounds: No murmur heard.   Pulmonary:     Effort: Pulmonary effort is normal. No respiratory distress.     Breath sounds: Normal breath sounds. No wheezing or rales.  Chest:     Chest wall: No tenderness.  Abdominal:     General: Abdomen is flat. There is no distension.     Palpations: Abdomen is soft. There is no mass.     Tenderness: There is no abdominal tenderness. There is no right CVA tenderness, left CVA tenderness,  guarding or rebound.     Hernia: No hernia is present.  Genitourinary:    Penis: Normal.      Testes: Normal.     Prostate: Normal.     Rectum: Normal. Guaiac result negative.  Musculoskeletal:        General: No tenderness.     Cervical back: Normal range of motion and neck supple.  Skin:    General: Skin is warm and dry.  Neurological:     Mental Status: He is oriented to person, place, and time.  Psychiatric:        Behavior: Behavior normal.        Thought Content: Thought content normal.        Judgment: Judgment normal.      BP 118/68 (BP Location: Right Arm, Patient Position: Sitting, Cuff Size: Normal)   Pulse 90   Temp 98.2 F (36.8 C) (Oral)   Resp 18   Ht 5\' 7"  (1.702 m)   Wt 150 lb 6.4 oz (68.2 kg)   SpO2 98%   BMI 23.56 kg/m  Wt Readings from Last 3 Encounters:  04/29/20 150 lb 6.4 oz (68.2 kg)  09/07/18 151 lb (68.5 kg)  05/25/18 150 lb 3.2 oz (68.1 kg)     Lab Results  Component Value Date   WBC 5.3 11/17/2018   HGB 14.5 11/17/2018   HCT 43.6 11/17/2018   PLT 224.0 11/17/2018   GLUCOSE 95 11/17/2018   CHOL 180 11/17/2018   TRIG 92.0 11/17/2018   HDL 42.10 11/17/2018   LDLCALC 119 (H) 11/17/2018   ALT 13 11/17/2018   AST 17 11/17/2018   NA 141 11/17/2018   K 4.5 11/20/2018   CL 104 11/17/2018   CREATININE 1.11 11/17/2018   BUN 20 11/17/2018   CO2 30 11/17/2018   PSA 1.38 11/17/2018   HGBA1C 5.8 11/17/2018    Patient was never admitted.   Assessment & Plan:  Plan  I am having Paul Carlson. Paul Carlson start on ciprofloxacin, metroNIDAZOLE, and pantoprazole. I am also having him maintain his acetaminophen, ibuprofen, aspirin-acetaminophen-caffeine, cetirizine, fluticasone, lisinopril, and gabapentin.  Meds ordered this encounter  Medications  . ciprofloxacin (CIPRO) 500 MG tablet    Sig: Take 1 tablet (500 mg total) by mouth 2 (two) times daily.    Dispense:  20 tablet    Refill:  0  . metroNIDAZOLE (FLAGYL) 500 MG tablet    Sig: Take 1 tablet (500 mg total) by mouth 3 (three) times daily.    Dispense:  21 tablet    Refill:  0  . pantoprazole (PROTONIX) 40 MG tablet    Sig: Take 1 tablet (40 mg total) by mouth daily.    Dispense:  30 tablet    Refill:  3    Problem List Items Addressed This Visit    None    Visit Diagnoses    Generalized abdominal pain    -  Primary   Relevant Medications   ciprofloxacin (CIPRO) 500 MG tablet   metroNIDAZOLE (FLAGYL) 500 MG tablet   pantoprazole (PROTONIX) 40 MG tablet   Other Relevant Orders   CBC with Differential/Platelet    Comprehensive metabolic panel   H. pylori breath test   POCT Urinalysis Dipstick (Automated)      Follow-up: Return if symptoms worsen or fail to improve.  Ann Held, DO

## 2020-04-29 NOTE — Assessment & Plan Note (Signed)
Hx diverticular disease on colon and gerd protonix daily Pt all to pcn--  cipro and flagyl F/u GI

## 2020-04-30 ENCOUNTER — Encounter: Payer: Self-pay | Admitting: Family Medicine

## 2020-04-30 LAB — CBC WITH DIFFERENTIAL/PLATELET
Absolute Monocytes: 429 cells/uL (ref 200–950)
Basophils Absolute: 52 cells/uL (ref 0–200)
Basophils Relative: 0.8 %
Eosinophils Absolute: 72 cells/uL (ref 15–500)
Eosinophils Relative: 1.1 %
HCT: 43.5 % (ref 38.5–50.0)
Hemoglobin: 14.9 g/dL (ref 13.2–17.1)
Lymphs Abs: 1229 cells/uL (ref 850–3900)
MCH: 31.4 pg (ref 27.0–33.0)
MCHC: 34.3 g/dL (ref 32.0–36.0)
MCV: 91.6 fL (ref 80.0–100.0)
MPV: 11.7 fL (ref 7.5–12.5)
Monocytes Relative: 6.6 %
Neutro Abs: 4719 cells/uL (ref 1500–7800)
Neutrophils Relative %: 72.6 %
Platelets: 221 10*3/uL (ref 140–400)
RBC: 4.75 10*6/uL (ref 4.20–5.80)
RDW: 12.9 % (ref 11.0–15.0)
Total Lymphocyte: 18.9 %
WBC: 6.5 10*3/uL (ref 3.8–10.8)

## 2020-04-30 LAB — URINE CULTURE
MICRO NUMBER:: 11090024
Result:: NO GROWTH
SPECIMEN QUALITY:: ADEQUATE

## 2020-04-30 LAB — COMPREHENSIVE METABOLIC PANEL
AG Ratio: 1.8 (calc) (ref 1.0–2.5)
ALT: 12 U/L (ref 9–46)
AST: 16 U/L (ref 10–35)
Albumin: 4.3 g/dL (ref 3.6–5.1)
Alkaline phosphatase (APISO): 74 U/L (ref 35–144)
BUN: 18 mg/dL (ref 7–25)
CO2: 27 mmol/L (ref 20–32)
Calcium: 9.8 mg/dL (ref 8.6–10.3)
Chloride: 105 mmol/L (ref 98–110)
Creat: 1.15 mg/dL (ref 0.70–1.18)
Globulin: 2.4 g/dL (calc) (ref 1.9–3.7)
Glucose, Bld: 79 mg/dL (ref 65–99)
Potassium: 4.5 mmol/L (ref 3.5–5.3)
Sodium: 140 mmol/L (ref 135–146)
Total Bilirubin: 0.7 mg/dL (ref 0.2–1.2)
Total Protein: 6.7 g/dL (ref 6.1–8.1)

## 2020-04-30 LAB — H. PYLORI BREATH TEST: H. pylori Breath Test: NOT DETECTED

## 2020-05-01 ENCOUNTER — Encounter: Payer: Self-pay | Admitting: Family Medicine

## 2020-05-01 NOTE — Telephone Encounter (Signed)
  Ciprofloxacin HCl 500 mg Oral 2 times daily   metroNIDAZOLE 500 mg Oral 3 times daily   Pantoprazole Sodium 40 mg Oral Daily Patient prescribed at last ov with Dr. Etter Sjogren.

## 2020-05-01 NOTE — Telephone Encounter (Signed)
Pt had noted pain in his belly with swallowing He also has some discomfort in his groin which comes and goes, from his description I am not very clear on exactly what is going on Pt states he has history of kidney stones but this is not the same symptoms He is not having any fever or vomiting He does not feel that he is getting worse, he is taking the antibiotics I offered to set him up for a CT scan to have him seen in the office tomorrow.  He states that he is too busy at the moment, he is willing to see me on Monday.  I will overbook him on my schedule at noon.  I did advise him to please seek immediate care at the ER if he is not doing okay in the meantime and he agrees

## 2020-05-03 NOTE — Progress Notes (Addendum)
[place] [facility] [address] [place], [place] [address] [phone]  Date:  05/05/2020   Name:  Paul Carlson   DOB:  June 18, 1946   MRN:  [id number]  PCP:  Darreld Mclean, MD    Chief Complaint: Hematuria and Abdominal Pain (abdominal pain radiates to groin, couple of weeks, no fever)   History of Present Illness:  Paul Carlson is a 74 y.o. very pleasant male patient who presents with the following:  Patient with history of migraine headache, hypertension, hyperlipidemia Seen today as a semiurgent working with concern of abdominal pain and groin pain Seen by my partner Dr. Etter Sjogren on October 19; at that time he had noticed abdominal cramping especially after eating or drinking.  Also some testicular pain with cough or bending over, possible GERD symptoms of burping She started him on Cipro, Flagyl, Protonix  The patient had contacted [place] with change in symptoms-I called him on 10/22 to discuss as follows Pt had noted pain in his belly with swallowing He also has some discomfort in his groin which comes and goes, from his description I am not very clear on exactly what is going on Pt states he has history of kidney stones but this is not the same symptoms He is not having any fever or vomiting He does not feel that he is getting worse, he is taking the antibiotics I offered to set him up for a CT scan to have him seen in the office tomorrow.  He states that he is too busy at the moment, he is willing to see me on Monday.  I will overbook him on my schedule at noon.  I did advise him to please seek immediate care at the ER if he is not doing okay in the meantime and he agrees  Since our last conversation pt had noted some LLQ pain after he would eat certain foods.  The pain might radiate under his umbilicus He went out to lunch yesterday- ate quite slowly.  However even this was causing him to cramp so he just drank some  tea.  He ate a late dinner last night and felt well This am he has felt ok so far - he ate a biscuit and did fine.  He hopes that his abdominal symptoms are now better  However if he twists or lifts he will get some pain in the right groin- a pulling sensation He did "2 hernias repaired" in the right inguinal area years ago He has noted a burning sensation in his right lower back and a mild burning sensation with urination- he wonders if he might be getting a kidney stone He reports a history of a large bladder stone which had to be broken up and then removed through the urethra years ago also He feels like his urinary flow is not quite as good as is normal for him   He has noted some loose stools but not diarrhea No vomiting or fever     Patient Active Problem List   Diagnosis Date Noted  . Generalized abdominal pain 04/29/2020  . History of hyperlipidemia 02/23/2016  . Essential hypertension 02/23/2016  . Migraine with aura and without status migrainosus, not intractable 02/19/2015    Past Medical History:  Diagnosis Date  . Borderline hypertension   . Cervical radiculopathy   . Frequent headaches   . GERD (gastroesophageal reflux disease)   . Migraines  Past Surgical History:  Procedure Laterality Date  . HERNIA REPAIR    . LITHOTRIPSY      Social History   Tobacco Use  . Smoking status: Never Smoker  . Smokeless tobacco: Never Used  Substance Use Topics  . Alcohol use: No  . Drug use: Not on file    Family History  Problem Relation Age of Onset  . Alzheimer's disease Father   . Hypertension Father   . Kidney disease Father   . Arthritis Father   . Cancer Paternal Grandfather        Prostate Cancer  . Alcohol abuse Neg Hx     Allergies  Allergen Reactions  . Azithromycin Other (See Comments)    Pt. reported that it gave him severe stomach cramps.  . Pollen Extract Other (See Comments)    Pt. reports nasal congestion  . Penicillins Rash     Medication list has been reviewed and updated.  Current Outpatient Medications on File Prior to Visit  Medication Sig Dispense Refill  . acetaminophen (TYLENOL) 650 MG CR tablet Take 650 mg by mouth at bedtime as needed for pain.    Marland Kitchen aspirin-acetaminophen-caffeine (EXCEDRIN MIGRAINE) 250-250-65 MG tablet Take 1-2 tablets by mouth as needed    . ciprofloxacin (CIPRO) 500 MG tablet Take 1 tablet (500 mg total) by mouth 2 (two) times daily. 20 tablet 0  . gabapentin (NEURONTIN) 400 MG capsule Take 3 capsules by mouth everyday at bedtime 270 capsule 0  . ibuprofen (ADVIL,MOTRIN) 200 MG tablet Take 600 mg by mouth as needed.    Marland Kitchen lisinopril (ZESTRIL) 5 MG tablet TAKE 1 TABLET BY MOUTH EVERY DAY 90 tablet 3  . metroNIDAZOLE (FLAGYL) 500 MG tablet Take 1 tablet (500 mg total) by mouth 3 (three) times daily. 21 tablet 0  . pantoprazole (PROTONIX) 40 MG tablet Take 1 tablet (40 mg total) by mouth daily. 30 tablet 3   No current facility-administered medications on file prior to visit.    Review of Systems:  As per HPI- otherwise negative.   Physical Examination: Vitals:   05/05/20 1159  BP: 126/72  Pulse: 63  Resp: 15  Temp: 98.5 F (36.9 C)  SpO2: 98%   Vitals:   05/05/20 1159  Weight: 153 lb (69.4 kg)  Height: 5\' 7"  (1.702 m)   Body mass index is 23.96 kg/m. Ideal Body Weight: Weight in (lb) to have BMI = 25: 159.3  GEN: no acute distress.  Normal weight, looks well HEENT: Atraumatic, Normocephalic.  Ears and Nose: No external deformity. CV: RRR, No M/G/R. No JVD. No thrill. No extra heart sounds. PULM: CTA B, no wheezes, crackles, rhonchi. No retractions. No resp. distress. No accessory muscle use. ABD: S, NT, ND, +BS. No rebound. No HSM. EXTR: No c/c/e PSYCH: Normally interactive. Conversant.  Belly is really benign today On exam, he does have a tiny bulge in the right inguinal region with cough.  This is not tender Prostate is not tender  Results for orders  placed or performed in visit on 05/05/20  POCT urinalysis dipstick  Result Value Ref Range   Color, UA yellow yellow   Clarity, UA clear clear   Glucose, UA negative negative mg/dL   Bilirubin, UA negative negative   Ketones, POC UA negative negative mg/dL   Spec Grav, UA >=1.030 (A) 1.010 - 1.025   Blood, UA negative negative   pH, UA 5.0 5.0 - 8.0   Protein Ur, POC negative negative mg/dL  Urobilinogen, UA 0.2 0.2 or 1.0 E.U./dL   Nitrite, UA Negative Negative   Leukocytes, UA Trace (A) Negative    Assessment and Plan: Hematuria, unspecified type - Plan: POCT urinalysis dipstick, Urine Culture  Abdominal pain, unspecified abdominal location - Plan: POCT urinalysis dipstick, Urine Culture  Epigastric pain - Plan: CT RENAL STONE STUDY, CANCELED: CT Abdomen Pelvis Wo Contrast  History of nephrolithiasis - Plan: CT RENAL STONE STUDY, CANCELED: CT Abdomen Pelvis Wo Contrast  Skin lesion of face - Plan: Ambulatory referral to Dermatology  Patient here today for follow-up of recent abdominal pain, groin pain, lower back pain.  We discussed this in detail.  I explained that his symptoms are suggestive of few different things.  It is possible he has had diverticulitis or prostatitis.  However, it seems that his abdominal pain is now getting better, and he is having more groin and lower back pain.  This may make a kidney stone or recurrent bladder stone more likely.  We plan to do a CT abdomen pelvis.  I explained that a noncontrasted CT is superior for imaging of the urinary system, though it is not as ideal for ruling out diverticulitis.  He states understanding,  proceed with CT today This visit occurred during the SARS-CoV-2 public health emergency.  Safety protocols were in place, including screening questions prior to the visit, additional usage of staff PPE, and extensive cleaning of exam room while observing appropriate contact time as indicated for disinfecting solutions.     Signed Lamar Blinks, MD  Received his CT report and discussed with him-evidence of diverticular disease but no acute diverticulitis noted on this noncontrasted scan. He does have suggestion of possible UTI, he is taking Cipro and urine culture is pending Evidence of kidney stones, we will refer him to see urology in [place] CT RENAL STONE STUDY  Result Date: 05/05/2020 CLINICAL DATA:  Testicular pain with hematuria EXAM: CT ABDOMEN AND PELVIS WITHOUT CONTRAST TECHNIQUE: Multidetector CT imaging of the abdomen and pelvis was performed following the standard protocol without IV contrast. COMPARISON:  None. FINDINGS: Lower chest: Lung bases demonstrate no acute consolidation or pleural effusion. Normal cardiac size. Small hiatal hernia Hepatobiliary: No focal liver abnormality is seen. No gallstones, gallbladder wall thickening, or biliary dilatation. Pancreas: Unremarkable. No pancreatic ductal dilatation or surrounding inflammatory changes. Spleen: Normal in size without focal abnormality. Adrenals/Urinary Tract: Adrenal glands are normal. Multiple punctate stones within the bilateral kidneys. Dominant 14 mm stone in the upper pole left kidney. Probable cyst lower pole left kidney. Slightly thick-walled urinary bladder with mild soft tissue stranding. Stomach/Bowel: Stomach is within normal limits. Appendix appears normal. No evidence of bowel wall thickening, distention, or inflammatory changes. Diverticular disease of the left colon without acute inflammatory change. Vascular/Lymphatic: Moderate aortic atherosclerosis. No aneurysm. No suspicious nodes. Reproductive: Prostate is unremarkable. Other: Negative for free air or free fluid. Small fat containing left inguinal hernia Musculoskeletal: No acute or significant osseous findings. Degenerative changes at L5-S1. IMPRESSION: 1. Slightly thick-walled urinary bladder with mild soft tissue stranding, question cystitis. 2. Multiple bilateral  intrarenal stones including 14 mm left kidney dominant stone. No hydronephrosis or ureteral stone. 3. Left colon diverticular disease without acute inflammatory change. Aortic Atherosclerosis (ICD10-I70.0). Electronically Signed   By: Donavan Foil M.D.   On: 05/05/2020 16:26

## 2020-05-05 ENCOUNTER — Encounter: Payer: Self-pay | Admitting: Family Medicine

## 2020-05-05 ENCOUNTER — Ambulatory Visit (INDEPENDENT_AMBULATORY_CARE_PROVIDER_SITE_OTHER): Payer: Medicare Other | Admitting: Family Medicine

## 2020-05-05 ENCOUNTER — Ambulatory Visit (HOSPITAL_BASED_OUTPATIENT_CLINIC_OR_DEPARTMENT_OTHER)
Admission: RE | Admit: 2020-05-05 | Discharge: 2020-05-05 | Disposition: A | Discharge status: Home / Self Care | Payer: Medicare Other | Source: Ambulatory Visit | Attending: Family Medicine | Admitting: Family Medicine

## 2020-05-05 ENCOUNTER — Other Ambulatory Visit: Payer: Self-pay

## 2020-05-05 VITALS — BP 126/72 | HR 63 | Temp 98.5°F | Resp 15 | Ht 67.0 in | Wt 153.0 lb

## 2020-05-05 DIAGNOSIS — R1013 Epigastric pain: Secondary | ICD-10-CM

## 2020-05-05 DIAGNOSIS — R109 Unspecified abdominal pain: Secondary | ICD-10-CM | POA: Diagnosis not present

## 2020-05-05 DIAGNOSIS — L989 Disorder of the skin and subcutaneous tissue, unspecified: Secondary | ICD-10-CM | POA: Diagnosis not present

## 2020-05-05 DIAGNOSIS — N200 Calculus of kidney: Secondary | ICD-10-CM | POA: Diagnosis not present

## 2020-05-05 DIAGNOSIS — K4090 Unilateral inguinal hernia, without obstruction or gangrene, not specified as recurrent: Secondary | ICD-10-CM | POA: Diagnosis not present

## 2020-05-05 DIAGNOSIS — Z87442 Personal history of urinary calculi: Secondary | ICD-10-CM

## 2020-05-05 DIAGNOSIS — R319 Hematuria, unspecified: Secondary | ICD-10-CM

## 2020-05-05 LAB — POCT URINALYSIS DIP (MANUAL ENTRY)
Bilirubin, UA: NEGATIVE
Blood, UA: NEGATIVE
Glucose, UA: NEGATIVE mg/dL
Ketones, POC UA: NEGATIVE mg/dL
Nitrite, UA: NEGATIVE
Protein Ur, POC: NEGATIVE mg/dL
Spec Grav, UA: >=1.03 — AB (ref 1.010–1.025)
Urobilinogen, UA: 0.2 E.U./dL
pH, UA: 5 (ref 5.0–8.0)

## 2020-05-05 NOTE — Patient Instructions (Signed)
Good to see you today- we will set you up for a CT scan of your abdomen and pelvis hopefully today Will also refer you to dermatology for the skin lesion on your right brow

## 2020-05-05 NOTE — Addendum Note (Signed)
Addended by: Lamar Blinks C on: 05/05/2020 04:35 PM   Modules accepted: Orders

## 2020-05-06 LAB — URINE CULTURE
MICRO NUMBER:: 11113772
Result:: NO GROWTH
SPECIMEN QUALITY:: ADEQUATE

## 2020-05-14 DIAGNOSIS — E508 Other manifestations of vitamin A deficiency: Secondary | ICD-10-CM | POA: Diagnosis not present

## 2020-05-14 DIAGNOSIS — D485 Neoplasm of uncertain behavior of skin: Secondary | ICD-10-CM | POA: Diagnosis not present

## 2020-05-14 DIAGNOSIS — L821 Other seborrheic keratosis: Secondary | ICD-10-CM | POA: Diagnosis not present

## 2020-05-19 ENCOUNTER — Other Ambulatory Visit: Payer: Self-pay | Admitting: Family Medicine

## 2020-05-19 DIAGNOSIS — M5010 Cervical disc disorder with radiculopathy, unspecified cervical region: Secondary | ICD-10-CM

## 2020-06-23 ENCOUNTER — Other Ambulatory Visit (HOSPITAL_BASED_OUTPATIENT_CLINIC_OR_DEPARTMENT_OTHER): Payer: Self-pay | Admitting: Internal Medicine

## 2020-06-23 ENCOUNTER — Ambulatory Visit: Payer: Medicare Other | Attending: Internal Medicine

## 2020-06-23 DIAGNOSIS — Z23 Encounter for immunization: Secondary | ICD-10-CM

## 2020-06-30 MED FILL — PFIZER-BIONTECH COVID-19 VA: 30 | 1 days supply | Qty: 0 | Fill #0

## 2020-07-09 NOTE — Progress Notes (Signed)
Subjective:   Paul Carlson is a 74 y.o. male who presents for Medicare Annual/Subsequent preventive examination.  Review of Systems     Cardiac Risk Factors include: advanced age (>52men, >37 women);male gender;hypertension;dyslipidemia;sedentary lifestyle     Objective:    Today's Vitals   07/10/20 1032  BP: 138/74  Pulse: 67  Resp: 16  Temp: 97.7 F (36.5 C)  TempSrc: Oral  SpO2: 97%  Weight: 162 lb 3.2 oz (73.6 kg)  Height: 5\' 7"  (1.702 m)   Body mass index is 25.4 kg/m.  Advanced Directives 07/10/2020 06/29/2019 05/25/2018  Does Patient Have a Medical Advance Directive? No No No  Would patient like information on creating a medical advance directive? Yes (MAU/Ambulatory/Procedural Areas - Information given) No - Patient declined Yes (MAU/Ambulatory/Procedural Areas - Information given)    Current Medications (verified) Outpatient Encounter Medications as of 07/10/2020  Medication Sig  . acetaminophen (TYLENOL) 650 MG CR tablet Take 650 mg by mouth at bedtime as needed for pain.  Marland Kitchen aspirin-acetaminophen-caffeine (EXCEDRIN MIGRAINE) 250-250-65 MG tablet Take 1-2 tablets by mouth as needed  . ciprofloxacin (CIPRO) 500 MG tablet Take 1 tablet (500 mg total) by mouth 2 (two) times daily.  Marland Kitchen gabapentin (NEURONTIN) 400 MG capsule TAKE 3 CAPSULES BY MOUTH EVERYDAY AT BEDTIME  . ibuprofen (ADVIL,MOTRIN) 200 MG tablet Take 600 mg by mouth as needed.  Marland Kitchen lisinopril (ZESTRIL) 5 MG tablet TAKE 1 TABLET BY MOUTH EVERY DAY  . metroNIDAZOLE (FLAGYL) 500 MG tablet Take 1 tablet (500 mg total) by mouth 3 (three) times daily.  . pantoprazole (PROTONIX) 40 MG tablet Take 1 tablet (40 mg total) by mouth daily.   No facility-administered encounter medications on file as of 07/10/2020.    Allergies (verified) Azithromycin, Pollen extract, and Penicillins   History: Past Medical History:  Diagnosis Date  . Borderline hypertension   . Cervical radiculopathy   . Frequent headaches    . GERD (gastroesophageal reflux disease)   . Migraines    Past Surgical History:  Procedure Laterality Date  . HERNIA REPAIR    . LITHOTRIPSY     Family History  Problem Relation Age of Onset  . Alzheimer's disease Father   . Hypertension Father   . Kidney disease Father   . Arthritis Father   . Cancer Paternal Grandfather        Prostate Cancer  . Alcohol abuse Neg Hx    Social History   Socioeconomic History  . Marital status: Married    Spouse name: Not on file  . Number of children: Not on file  . Years of education: Not on file  . Highest education level: Not on file  Occupational History  . Occupation: Pastor  Tobacco Use  . Smoking status: Never Smoker  . Smokeless tobacco: Never Used  Substance and Sexual Activity  . Alcohol use: No  . Drug use: Not on file  . Sexual activity: Not on file  Other Topics Concern  . Not on file  Social History Narrative  . Not on file   Social Determinants of Health   Financial Resource Strain: Low Risk   . Difficulty of Paying Living Expenses: Not hard at all  Food Insecurity: No Food Insecurity  . Worried About Charity fundraiser in the Last Year: Never true  . Ran Out of Food in the Last Year: Never true  Transportation Needs: No Transportation Needs  . Lack of Transportation (Medical): No  . Lack of Transportation (Non-Medical):  No  Physical Activity: Inactive  . Days of Exercise per Week: 0 days  . Minutes of Exercise per Session: 0 min  Stress: No Stress Concern Present  . Feeling of Stress : Not at all  Social Connections: Socially Integrated  . Frequency of Communication with Friends and Family: More than three times a week  . Frequency of Social Gatherings with Friends and Family: More than three times a week  . Attends Religious Services: More than 4 times per year  . Active Member of Clubs or Organizations: Yes  . Attends Archivist Meetings: More than 4 times per year  . Marital Status:  Married    Tobacco Counseling Counseling given: Not Answered   Clinical Intake:  Pre-visit preparation completed: Yes  Pain : No/denies pain     Nutritional Status: BMI 25 -29 Overweight Nutritional Risks: None Diabetes: No  How often do you need to have someone help you when you read instructions, pamphlets, or other written materials from your doctor or pharmacy?: 1 - Never What is the last grade level you completed in school?: College  Diabetic?No  Interpreter Needed?: No  Information entered by :: Paul Hamman LPN   Activities of Daily Living In your present state of health, do you have any difficulty performing the following activities: 07/10/2020  Hearing? N  Vision? N  Difficulty concentrating or making decisions? N  Walking or climbing stairs? N  Dressing or bathing? N  Doing errands, shopping? N  Preparing Food and eating ? N  Using the Toilet? N  In the past six months, have you accidently leaked urine? Y  Comment occasionally  Do you have problems with loss of bowel control? N  Managing your Medications? N  Managing your Finances? N  Housekeeping or managing your Housekeeping? N  Some recent data might be hidden    Patient Care Team: Copland, Gay Filler, MD as PCP - General (Family Medicine)  Indicate any recent Medical Services you may have received from other than [facility] providers in the past year (date may be approximate).     Assessment:   This is a routine wellness examination for Paul Carlson.  Hearing/Vision screen  Hearing Screening   125Hz  250Hz  500Hz  1000Hz  2000Hz  3000Hz  4000Hz  6000Hz  8000Hz   Right ear:           Left ear:           Comments: Some hearing loss  Vision Screening Comments: Wears glasses Last eye exam-2021-[facility]  Dietary issues and exercise activities discussed: Current Exercise Habits: The patient does not participate in regular exercise at present, Exercise limited by: None identified  Goals    . DIET  - INCREASE WATER INTAKE      Depression Screen PHQ 2/9 Scores 07/10/2020 06/29/2019 05/25/2018 08/24/2017  PHQ - 2 Score 0 0 0 0    Fall Risk Fall Risk  07/10/2020 06/29/2019 05/25/2018 08/24/2017  Falls in the past year? 0 0 1 No  Number falls in past yr: 0 - 0 -  Injury with Fall? 0 - 0 -  Follow up Falls prevention discussed Education provided;Falls prevention discussed - -    FALL RISK PREVENTION PERTAINING TO THE HOME:  Any stairs in or around the home? Yes  If so, are there any without handrails? No  Home free of loose throw rugs in walkways, pet beds, electrical cords, etc? Yes  Adequate lighting in your home to reduce risk of falls? Yes   ASSISTIVE DEVICES UTILIZED  TO PREVENT FALLS:  Life alert? No  Use of a cane, walker or w/c? No  Grab bars in the bathroom? No  Shower chair or bench in shower? No  Elevated toilet seat or a handicapped toilet? No   TIMED UP AND GO:  Was the test performed? Yes .  Length of time to ambulate 10 feet: 9 sec.   Gait steady and fast without use of assistive device  Cognitive Function:        Immunizations Immunization History  Administered Date(s) Administered  . PFIZER SARS-COV-2 Vaccination 10/01/2019, 11/08/2019, 06/23/2020  . Tdap 02/19/2015    TDAP status: Up to date  Flu Vaccine status: Declined, Education has been provided regarding the importance of this vaccine but patient still declined. Advised may receive this vaccine at local pharmacy or Health Dept. Aware to provide a copy of the vaccination record if obtained from local pharmacy or Health Dept. Verbalized acceptance and understanding.  Pneumococcal vaccine status: Declined,  Education has been provided regarding the importance of this vaccine but patient still declined. Advised may receive this vaccine at local pharmacy or Health Dept. Aware to provide a copy of the vaccination record if obtained from local pharmacy or Health Dept. Verbalized acceptance and  understanding.   Covid-19 vaccine status: Completed vaccines  Qualifies for Shingles Vaccine? Yes   Zostavax completed No   Shingrix Completed?: No.    Education has been provided regarding the importance of this vaccine. Patient has been advised to call insurance company to determine out of pocket expense if they have not yet received this vaccine. Advised may also receive vaccine at local pharmacy or Health Dept. Verbalized acceptance and understanding.  Screening Tests Health Maintenance  Topic Date Due  . PNA vac Low Risk Adult (1 of 2 - PCV13) Never done  . INFLUENZA VACCINE  Never done  . COLONOSCOPY (Pts 45-56yrs Insurance coverage will need to be confirmed)  11/08/2020  . TETANUS/TDAP  02/18/2025  . COVID-19 Vaccine  Completed  . Hepatitis C Screening  Completed    Health Maintenance  Health Maintenance Due  Topic Date Due  . PNA vac Low Risk Adult (1 of 2 - PCV13) Never done  . INFLUENZA VACCINE  Never done    Colorectal cancer screening: Type of screening: Colonoscopy. Completed 11/08/2017. Repeat every 10 years  Lung Cancer Screening: (Low Dose CT Chest recommended if Age 61-80 years, 30 pack-year currently smoking OR have quit w/in 15years.) does not qualify.    Additional Screening:  Hepatitis C Screening: Completed 08/29/2017  Vision Screening: Recommended annual ophthalmology exams for early detection of glaucoma and other disorders of the eye. Is the patient up to date with their annual eye exam?  Yes  Who is the provider or what is the name of the office in which the patient attends annual eye exams? [facility]  Dental Screening: Recommended annual dental exams for proper oral hygiene  Community Resource Referral / Chronic Care Management: CRR required this visit?  No   CCM required this visit?  No      Plan:     I have personally reviewed and noted the following in the patient's chart:   . Medical and social history . Use of alcohol,  tobacco or illicit drugs  . Current medications and supplements . Functional ability and status . Nutritional status . Physical activity . Advanced directives . List of other physicians . Hospitalizations, surgeries, and ER visits in previous 12 months . Vitals .  Screenings to include cognitive, depression, and falls . Referrals and appointments  In addition, I have reviewed and discussed with patient certain preventive protocols, quality metrics, and best practice recommendations. A written personalized care plan for preventive services as well as general preventive health recommendations were provided to patient.   Patient to access AVS via mychart.   Roanna Raider, LPN   30/16/0109  Nurse Health Advisor  Nurse Notes: None

## 2020-07-10 ENCOUNTER — Ambulatory Visit: Payer: Self-pay | Admitting: *Deleted

## 2020-07-10 ENCOUNTER — Other Ambulatory Visit: Payer: Self-pay

## 2020-07-10 ENCOUNTER — Ambulatory Visit (INDEPENDENT_AMBULATORY_CARE_PROVIDER_SITE_OTHER): Payer: Medicare Other

## 2020-07-10 VITALS — BP 138/74 | HR 67 | Temp 97.7°F | Resp 16 | Ht 67.0 in | Wt 162.2 lb

## 2020-07-10 DIAGNOSIS — Z0000 Encounter for general adult medical examination without abnormal findings: Secondary | ICD-10-CM

## 2020-07-10 NOTE — Patient Instructions (Signed)
Paul Carlson , Thank you for taking time to come for your Medicare Wellness Visit. I appreciate your ongoing commitment to your health goals. Please review the following plan we discussed and let me know if I can assist you in the future.   Screening recommendations/referrals: Colonoscopy: Completed 11/08/2017-Due-11/08/2020 Recommended yearly ophthalmology/optometry visit for glaucoma screening and checkup Recommended yearly dental visit for hygiene and checkup  Vaccinations: Influenza vaccine: Declined Pneumococcal vaccine: Declined Tdap vaccine: Up to date-Due-02/18/2025 Shingles vaccine: Discuss with pharmacy  Covid-19: Completed vaccines  Advanced directives: Information given today  Conditions/risks identified: See problem list  Next appointment: Follow up in one year for your annual wellness visit. 07/16/2021 @ 10:20am  Preventive Care 72 Years and Older, Male Preventive care refers to lifestyle choices and visits with your health care provider that can promote health and wellness. What does preventive care include?  A yearly physical exam. This is also called an annual well check.  Dental exams once or twice a year.  Routine eye exams. Ask your health care provider how often you should have your eyes checked.  Personal lifestyle choices, including:  Daily care of your teeth and gums.  Regular physical activity.  Eating a healthy diet.  Avoiding tobacco and drug use.  Limiting alcohol use.  Practicing safe sex.  Taking low doses of aspirin every day.  Taking vitamin and mineral supplements as recommended by your health care provider. What happens during an annual well check? The services and screenings done by your health care provider during your annual well check will depend on your age, overall health, lifestyle risk factors, and family history of disease. Counseling  Your health care provider may ask you questions about your:  Alcohol use.  Tobacco  use.  Drug use.  Emotional well-being.  Home and relationship well-being.  Sexual activity.  Eating habits.  History of falls.  Memory and ability to understand (cognition).  Work and work Astronomer. Screening  You may have the following tests or measurements:  Height, weight, and BMI.  Blood pressure.  Lipid and cholesterol levels. These may be checked every 5 years, or more frequently if you are over 4 years old.  Skin check.  Lung cancer screening. You may have this screening every year starting at age 8 if you have a 30-pack-year history of smoking and currently smoke or have quit within the past 15 years.  Fecal occult blood test (FOBT) of the stool. You may have this test every year starting at age 41.  Flexible sigmoidoscopy or colonoscopy. You may have a sigmoidoscopy every 5 years or a colonoscopy every 10 years starting at age 52.  Prostate cancer screening. Recommendations will vary depending on your family history and other risks.  Hepatitis C blood test.  Hepatitis B blood test.  Sexually transmitted disease (STD) testing.  Diabetes screening. This is done by checking your blood sugar (glucose) after you have not eaten for a while (fasting). You may have this done every 1-3 years.  Abdominal aortic aneurysm (AAA) screening. You may need this if you are a current or former smoker.  Osteoporosis. You may be screened starting at age 31 if you are at high risk. Talk with your health care provider about your test results, treatment options, and if necessary, the need for more tests. Vaccines  Your health care provider may recommend certain vaccines, such as:  Influenza vaccine. This is recommended every year.  Tetanus, diphtheria, and acellular pertussis (Tdap, Td) vaccine. You may need a  Td booster every 10 years.  Zoster vaccine. You may need this after age 33.  Pneumococcal 13-valent conjugate (PCV13) vaccine. One dose is recommended after age  79.  Pneumococcal polysaccharide (PPSV23) vaccine. One dose is recommended after age 65. Talk to your health care provider about which screenings and vaccines you need and how often you need them. This information is not intended to replace advice given to you by your health care provider. Make sure you discuss any questions you have with your health care provider. Document Released: 07/25/2015 Document Revised: 03/17/2016 Document Reviewed: 04/29/2015 Elsevier Interactive Patient Education  2017 [employer] Prevention in the Home Falls can cause injuries. They can happen to people of all ages. There are many things you can do to make your home safe and to help prevent falls. What can I do on the outside of my home?  Regularly fix the edges of walkways and driveways and fix any cracks.  Remove anything that might make you trip as you walk through a door, such as a raised step or threshold.  Trim any bushes or trees on the path to your home.  Use bright outdoor lighting.  Clear any walking paths of anything that might make someone trip, such as rocks or tools.  Regularly check to see if handrails are loose or broken. Make sure that both sides of any steps have handrails.  Any raised decks and porches should have guardrails on the edges.  Have any leaves, snow, or ice cleared regularly.  Use sand or salt on walking paths during winter.  Clean up any spills in your garage right away. This includes oil or grease spills. What can I do in the bathroom?  Use night lights.  Install grab bars by the toilet and in the tub and shower. Do not use towel bars as grab bars.  Use non-skid mats or decals in the tub or shower.  If you need to sit down in the shower, use a plastic, non-slip stool.  Keep the floor dry. Clean up any water that spills on the floor as soon as it happens.  Remove soap buildup in the tub or shower regularly.  Attach bath mats securely with double-sided  non-slip rug tape.  Do not have throw rugs and other things on the floor that can make you trip. What can I do in the bedroom?  Use night lights.  Make sure that you have a light by your bed that is easy to reach.  Do not use any sheets or blankets that are too big for your bed. They should not hang down onto the floor.  Have a firm chair that has side arms. You can use this for support while you get dressed.  Do not have throw rugs and other things on the floor that can make you trip. What can I do in the kitchen?  Clean up any spills right away.  Avoid walking on wet floors.  Keep items that you use a lot in easy-to-reach places.  If you need to reach something above you, use a strong step stool that has a grab bar.  Keep electrical cords out of the way.  Do not use floor polish or wax that makes floors slippery. If you must use wax, use non-skid floor wax.  Do not have throw rugs and other things on the floor that can make you trip. What can I do with my stairs?  Do not leave any items on the stairs.  Make sure that there are handrails on both sides of the stairs and use them. Fix handrails that are broken or loose. Make sure that handrails are as long as the stairways.  Check any carpeting to make sure that it is firmly attached to the stairs. Fix any carpet that is loose or worn.  Avoid having throw rugs at the top or bottom of the stairs. If you do have throw rugs, attach them to the floor with carpet tape.  Make sure that you have a light switch at the top of the stairs and the bottom of the stairs. If you do not have them, ask someone to add them for you. What else can I do to help prevent falls?  Wear shoes that:  Do not have high heels.  Have rubber bottoms.  Are comfortable and fit you well.  Are closed at the toe. Do not wear sandals.  If you use a stepladder:  Make sure that it is fully opened. Do not climb a closed stepladder.  Make sure that both  sides of the stepladder are locked into place.  Ask someone to hold it for you, if possible.  Clearly mark and make sure that you can see:  Any grab bars or handrails.  First and last steps.  Where the edge of each step is.  Use tools that help you move around (mobility aids) if they are needed. These include:  Canes.  Walkers.  Scooters.  Crutches.  Turn on the lights when you go into a dark area. Replace any light bulbs as soon as they burn out.  Set up your furniture so you have a clear path. Avoid moving your furniture around.  If any of your floors are uneven, fix them.  If there are any pets around you, be aware of where they are.  Review your medicines with your doctor. Some medicines can make you feel dizzy. This can increase your chance of falling. Ask your doctor what other things that you can do to help prevent falls. This information is not intended to replace advice given to you by your health care provider. Make sure you discuss any questions you have with your health care provider. Document Released: 04/24/2009 Document Revised: 12/04/2015 Document Reviewed: 08/02/2014 Elsevier Interactive Patient Education  2017 [employer].

## 2020-07-21 ENCOUNTER — Other Ambulatory Visit: Payer: Self-pay | Admitting: Family Medicine

## 2020-07-21 DIAGNOSIS — R1084 Generalized abdominal pain: Secondary | ICD-10-CM

## 2020-08-18 ENCOUNTER — Other Ambulatory Visit: Payer: Self-pay | Admitting: Family Medicine

## 2020-08-18 DIAGNOSIS — M5010 Cervical disc disorder with radiculopathy, unspecified cervical region: Secondary | ICD-10-CM

## 2021-01-16 ENCOUNTER — Emergency Department (HOSPITAL_BASED_OUTPATIENT_CLINIC_OR_DEPARTMENT_OTHER): Payer: Medicare Other

## 2021-01-16 ENCOUNTER — Emergency Department (HOSPITAL_BASED_OUTPATIENT_CLINIC_OR_DEPARTMENT_OTHER)
Admission: EM | Admit: 2021-01-16 | Discharge: 2021-01-16 | Disposition: A | Discharge status: Home / Self Care | Payer: Medicare Other | Attending: Emergency Medicine | Admitting: Emergency Medicine

## 2021-01-16 ENCOUNTER — Encounter (HOSPITAL_BASED_OUTPATIENT_CLINIC_OR_DEPARTMENT_OTHER): Payer: Self-pay | Admitting: *Deleted

## 2021-01-16 ENCOUNTER — Other Ambulatory Visit: Payer: Self-pay

## 2021-01-16 DIAGNOSIS — Z79899 Other long term (current) drug therapy: Secondary | ICD-10-CM | POA: Insufficient documentation

## 2021-01-16 DIAGNOSIS — R531 Weakness: Secondary | ICD-10-CM | POA: Diagnosis not present

## 2021-01-16 DIAGNOSIS — I10 Essential (primary) hypertension: Secondary | ICD-10-CM | POA: Diagnosis not present

## 2021-01-16 DIAGNOSIS — R0789 Other chest pain: Secondary | ICD-10-CM | POA: Insufficient documentation

## 2021-01-16 DIAGNOSIS — R5383 Other fatigue: Secondary | ICD-10-CM | POA: Diagnosis not present

## 2021-01-16 DIAGNOSIS — Z20822 Contact with and (suspected) exposure to covid-19: Secondary | ICD-10-CM | POA: Diagnosis not present

## 2021-01-16 DIAGNOSIS — R059 Cough, unspecified: Secondary | ICD-10-CM | POA: Diagnosis not present

## 2021-01-16 DIAGNOSIS — R079 Chest pain, unspecified: Secondary | ICD-10-CM

## 2021-01-16 DIAGNOSIS — R053 Chronic cough: Secondary | ICD-10-CM | POA: Diagnosis not present

## 2021-01-16 DIAGNOSIS — R202 Paresthesia of skin: Secondary | ICD-10-CM | POA: Diagnosis not present

## 2021-01-16 DIAGNOSIS — W57XXXA Bitten or stung by nonvenomous insect and other nonvenomous arthropods, initial encounter: Secondary | ICD-10-CM | POA: Diagnosis not present

## 2021-01-16 LAB — HEPATIC FUNCTION PANEL
ALT: 14 U/L (ref 0–44)
AST: 25 U/L (ref 15–41)
Albumin: 3.9 g/dL (ref 3.5–5.0)
Alkaline Phosphatase: 55 U/L (ref 38–126)
Bilirubin, Direct: 0.4 mg/dL — ABNORMAL HIGH (ref 0.0–0.2)
Indirect Bilirubin: 0.9 mg/dL (ref 0.3–0.9)
Total Bilirubin: 1.3 mg/dL — ABNORMAL HIGH (ref 0.3–1.2)
Total Protein: 6.6 g/dL (ref 6.5–8.1)

## 2021-01-16 LAB — BASIC METABOLIC PANEL
Anion gap: 5 (ref 5–15)
BUN: 16 mg/dL (ref 8–23)
CO2: 28 mmol/L (ref 22–32)
Calcium: 9.4 mg/dL (ref 8.9–10.3)
Chloride: 105 mmol/L (ref 98–111)
Creatinine, Ser: 1.39 mg/dL — ABNORMAL HIGH (ref 0.61–1.24)
GFR, Estimated: 54 mL/min — ABNORMAL LOW (ref >60)
Glucose, Bld: 86 mg/dL (ref 70–99)
Potassium: 5.3 mmol/L — ABNORMAL HIGH (ref 3.5–5.1)
Sodium: 138 mmol/L (ref 135–145)

## 2021-01-16 LAB — URINALYSIS, ROUTINE W REFLEX MICROSCOPIC
Bilirubin Urine: NEGATIVE
Glucose, UA: NEGATIVE mg/dL
Hgb urine dipstick: NEGATIVE
Ketones, ur: NEGATIVE mg/dL
Leukocytes,Ua: NEGATIVE
Nitrite: NEGATIVE
Protein, ur: NEGATIVE mg/dL
Specific Gravity, Urine: 1.015 (ref 1.005–1.030)
pH: 6 (ref 5.0–8.0)

## 2021-01-16 LAB — CBC WITH DIFFERENTIAL/PLATELET
Abs Immature Granulocytes: 0.02 10*3/uL (ref 0.00–0.07)
Basophils Absolute: 0.1 10*3/uL (ref 0.0–0.1)
Basophils Relative: 1 %
Eosinophils Absolute: 0.2 10*3/uL (ref 0.0–0.5)
Eosinophils Relative: 2 %
HCT: 43.2 % (ref 39.0–52.0)
Hemoglobin: 14.2 g/dL (ref 13.0–17.0)
Immature Granulocytes: 0 %
Lymphocytes Relative: 29 %
Lymphs Abs: 2 10*3/uL (ref 0.7–4.0)
MCH: 30.7 pg (ref 26.0–34.0)
MCHC: 32.9 g/dL (ref 30.0–36.0)
MCV: 93.3 fL (ref 80.0–100.0)
Monocytes Absolute: 0.4 10*3/uL (ref 0.1–1.0)
Monocytes Relative: 6 %
Neutro Abs: 4.2 10*3/uL (ref 1.7–7.7)
Neutrophils Relative %: 62 %
Platelets: 187 10*3/uL (ref 150–400)
RBC: 4.63 MIL/uL (ref 4.22–5.81)
RDW: 14.1 % (ref 11.5–15.5)
WBC: 6.9 10*3/uL (ref 4.0–10.5)
nRBC: 0 % (ref 0.0–0.2)

## 2021-01-16 LAB — RESP PANEL BY RT-PCR (FLU A&B, COVID) ARPGX2
Influenza A by PCR: NEGATIVE
Influenza B by PCR: NEGATIVE
SARS Coronavirus 2 by RT PCR: NEGATIVE

## 2021-01-16 LAB — TROPONIN I (HIGH SENSITIVITY)
Troponin I (High Sensitivity): 6 ng/L (ref <18)
Troponin I (High Sensitivity): 6 ng/L (ref <18)

## 2021-01-16 MED ORDER — SODIUM CHLORIDE 0.9 % IV BOLUS
1000.0000 mL | Freq: Once | INTRAVENOUS | Status: AC
  Administered 2021-01-16: 1000 mL via INTRAVENOUS

## 2021-01-16 MED ORDER — DOXYCYCLINE HYCLATE 100 MG PO CAPS: 100.0000 mg | ORAL_CAPSULE | Freq: Two times a day (BID) | ORAL | 0 refills | Status: AC

## 2021-01-16 NOTE — ED Triage Notes (Signed)
Multiple tick bites in the past 4 weeks. He has been weak and has a raw feeling in his chest since.

## 2021-01-16 NOTE — ED Notes (Signed)
Pt states multiple tick bites 2 weeks ago, states woke up this morning and just felt "foggy headed", states some chest discomfort this morning but denies an pain at this time.  Pt states did have a long weekend with 2-3 hrs of sleep every night.  States "I slept 6 hrs last night and should have hit the ground running but I didn't".

## 2021-01-16 NOTE — ED Notes (Signed)
ED Provider at bedside. 

## 2021-01-16 NOTE — Discharge Instructions (Addendum)
It was a pleasure taking care of you today. As discussed, your labs were reassuring today. Your tickborne illness labs are pending and should be available within the next few days. I am sending you home with an antibiotic to cover for tickborne illness. Your COVID test is negative. Follow-up with PCP within the next week for further evaluation. Return to the ER for new or worsening symptoms.

## 2021-01-16 NOTE — ED Provider Notes (Signed)
[facility] Provider Note   CSN: [id number] Arrival date & time: 01/16/21  1358     History Chief Complaint  Patient presents with   Tick Bite    Paul Carlson is a 75 y.o. male with a past medical history significant for GERD, hypertension, and history of migraines who presents to the ED due to fatigue, chest tightness, perioral numbness/tingling that started earlier today. Patient notes he has been extremely tired over the past 24 hours and even had to take breaks while shaving this morning. He states he experienced chest tightness this morning that resolved. He notes he recently had 4 tick bites (2 4 weeks ago and 2 2 weeks ago). He notes he developed a bilateral upper extremity arm rash a few weeks ago associated with pruritis which he believe was poison oak vs. Poison ivy. He notes ticks were attached for less than 24 hours. No bulls eye rash surrounding tick bites. Denies fever and chills. 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 He admits to a mild dry cough. No shortness of breath. No treatment prior to arrival. No aggravating or alleviating symptoms.   History obtained from patient and past medical records. No interpreter used during encounter.       Past Medical History:  Diagnosis Date   Borderline hypertension    Cervical radiculopathy    Frequent headaches    GERD (gastroesophageal reflux disease)    Migraines     Patient Active Problem List   Diagnosis Date Noted   Generalized abdominal pain 04/29/2020   History of hyperlipidemia 02/23/2016   Essential hypertension 02/23/2016   Migraine with aura and without status migrainosus, not intractable 02/19/2015    Past Surgical History:  Procedure Laterality Date   HERNIA REPAIR     LITHOTRIPSY         Family History  Problem Relation Age of Onset   Alzheimer's disease Father    Hypertension Father    Kidney disease Father    Arthritis Father     Cancer Paternal Grandfather        Prostate Cancer   Alcohol abuse Neg Hx     Social History   Tobacco Use   Smoking status: Never   Smokeless tobacco: Never  Substance Use Topics   Alcohol use: No    Home Medications Prior to Admission medications   Medication Sig Start Date End Date Taking? Authorizing Provider  doxycycline (VIBRAMYCIN) 100 MG capsule Take 1 capsule (100 mg total) by mouth 2 (two) times daily for 7 days. 01/16/21 01/23/21 Yes Rudy Luhmann, Druscilla Brownie, PA-C  gabapentin (NEURONTIN) 400 MG capsule Take 3 capsules (1,200 mg total) by mouth at bedtime. 08/19/20  Yes Copland, Gay Filler, MD  lisinopril (ZESTRIL) 5 MG tablet TAKE 1 TABLET BY MOUTH EVERY DAY 01/23/20  Yes Copland, Gay Filler, MD  pantoprazole (PROTONIX) 40 MG tablet Take 1 tablet (40 mg total) by mouth daily. 07/21/20  Yes Copland, Gay Filler, MD  acetaminophen (TYLENOL) 650 MG CR tablet Take 650 mg by mouth at bedtime as needed for pain.    [provider]  aspirin-acetaminophen-caffeine (EXCEDRIN MIGRAINE) [phone] MG tablet Take 1-2 tablets by mouth as needed    [provider]  ciprofloxacin (CIPRO) 500 MG tablet Take 1 tablet (500 mg total) by mouth 2 (two) times daily. 04/29/20   Ann Held, DO  COVID-19 mRNA vaccine, Pfizer, 30 MCG/0.3ML injection INJECT AS DIRECTED 06/23/20  06/23/21  Carlyle Basques, MD  ibuprofen (ADVIL,MOTRIN) 200 MG tablet Take 600 mg by mouth as needed.    [provider]  metroNIDAZOLE (FLAGYL) 500 MG tablet Take 1 tablet (500 mg total) by mouth 3 (three) times daily. 04/29/20   Ann Held, DO    Allergies    Azithromycin, Pollen extract, and Penicillins  Review of Systems   Review of Systems  Constitutional:  Positive for activity change and fatigue. Negative for chills and fever.  Respiratory:  Negative for shortness of breath.   Cardiovascular:  Positive for chest pain. Negative for leg swelling.  Gastrointestinal:  Negative for  abdominal pain, diarrhea, nausea and vomiting.  Neurological:  Positive for weakness (generalized) and numbness.  All other systems reviewed and are negative.  Physical Exam Updated Vital Signs BP (!) 144/79 (BP Location: Right Arm)   Pulse (!) 56   Temp 98.3 F (36.8 C) (Oral)   Resp 18   Ht 5\' 7"  (1.702 m)   Wt 68 kg   SpO2 100%   BMI 23.49 kg/m   Physical Exam Vitals and nursing note reviewed.  Constitutional:      General: He is not in acute distress.    Appearance: He is not ill-appearing.  HENT:     Head: Normocephalic.  Eyes:     Pupils: Pupils are equal, round, and reactive to light.  Cardiovascular:     Rate and Rhythm: Normal rate and regular rhythm.     Pulses: Normal pulses.     Heart sounds: Normal heart sounds. No murmur heard.   No friction rub. No gallop.  Pulmonary:     Effort: Pulmonary effort is normal.     Breath sounds: Normal breath sounds.  Abdominal:     General: Abdomen is flat. There is no distension.     Palpations: Abdomen is soft.     Tenderness: There is no abdominal tenderness. There is no guarding or rebound.  Musculoskeletal:        General: Normal range of motion.     Cervical back: Neck supple.  Skin:    General: Skin is warm and dry.     Comments: No rash  Neurological:     General: No focal deficit present.     Mental Status: He is alert.  Psychiatric:        Mood and Affect: Mood normal.        Behavior: Behavior normal.    ED Results / Procedures / Treatments   Labs (all labs ordered are listed, but only abnormal results are displayed) Labs Reviewed  BASIC METABOLIC PANEL - Abnormal; Notable for the following components:      Result Value   Potassium 5.3 (*)    Creatinine, Ser 1.39 (*)    GFR, Estimated 54 (*)    All other components within normal limits  RESP PANEL BY RT-PCR (FLU A&B, COVID) ARPGX2  CBC WITH DIFFERENTIAL/PLATELET  ROCKY MTN SPOTTED FVR ABS PNL(IGG+IGM)  LYME DISEASE, WESTERN BLOT  HEPATIC  FUNCTION PANEL  URINALYSIS, ROUTINE W REFLEX MICROSCOPIC  TROPONIN I (HIGH SENSITIVITY)  TROPONIN I (HIGH SENSITIVITY)    EKG EKG Interpretation  Date/Time:  Friday January 16 2021 14:08:40 EDT Ventricular Rate:  60 PR Interval:  138 QRS Duration: 94 QT Interval:  390 QTC Calculation: 390 R Axis:   69 Text Interpretation: Normal sinus rhythm Normal ECG No previous ECGs available Confirmed by Theotis Burrow [phone]) on 01/16/2021 6:11:20 PM  Radiology DG Chest  1 View  Result Date: 01/16/2021 CLINICAL DATA:  Weakness.  Chronic cough.  Ex-smoker EXAM: CHEST  1 VIEW COMPARISON:  10/16/2018 from [facility] FINDINGS: Midline trachea. Normal heart size. Atherosclerosis in the transverse aorta. No pleural effusion or pneumothorax. Mild hyperinflation. Mild left base scarring laterally. The anterior first right rib is prominent, similar. IMPRESSION: Mild hyperinflation, without acute disease. Aortic Atherosclerosis (ICD10-I70.0). Electronically Signed   By: Abigail Miyamoto M.D.   On: 01/16/2021 18:05    Procedures Procedures   Medications Ordered in ED Medications  sodium chloride 0.9 % bolus 1,000 mL (1,000 mLs Intravenous New Bag/Given 01/16/21 1834)    ED Course  I have reviewed the triage vital signs and the nursing notes.  Pertinent labs & imaging results that were available during my care of the patient were reviewed by me and considered in my medical decision making (see chart for details).  Clinical Course as of 01/16/21 1932  Fri Jan 16, 2021  7681 Potassium(!): 5.3 [CA]  1809 Creatinine(!): 1.39 [CA]    Clinical Course User Index [CA] Suzy Bouchard, PA-C   MDM Rules/Calculators/A&P                         75 year old male presents to the ED due to fatigue, chest tightness, perioral tingling, and bilateral arm rash after 4 tick bites within the past 4 weeks ago. Patient states he has also been exposed to numerous individuals with COVID recently. Upon arrival, vitals  all within normal limits. Patient non-toxic appearing. Physical exam reassuring. No rash throughout body. Tickborne labs ordered. Cardiac labs given chest tightness earlier today. Patient is currently chest pain free. Presentation not concerning for dissection or PE.   CBC unremarkable with no leukocytosis and normal hemoglobin.  Initial troponin normal at 6.  Will obtain delta troponin to rule out ACS.  BMP significant for hyperkalemia 5.3 and elevated creatinine at 1.39.  IV fluids given.  COVID/influenza negative. Hepatic function panel added to check LFTs due to possible tickborne illness. CXR personally reviewed which is negative for widened mediastinum, pneumothorax, or pneumonia.  EKG personally reviewed which demonstrates normal sinus rhythm with no signs of acute ischemia. UA unremarkable. No signs of infection.   Delta troponin flat. Low suspicion for ACS. Hepatic function panel with mild elevation in total bilirubin. Normal AST and ALT. Patient discharged with doxycyline for possible tickborne illness. Advised patient to follow-up with PCP within the next week for further evaluation. Strict ED precautions discussed with patient. Patient states understanding and agrees to plan. Patient discharged home in no acute distress and stable vitals  Discussed case with Dr. Rex Kras who evaluated patient at bedside who agrees with assessment and plan.  Final Clinical Impression(s) / ED Diagnoses Final diagnoses:  Nonspecific chest pain  Fatigue, unspecified type    Rx / DC Orders ED Discharge Orders          Ordered    doxycycline (VIBRAMYCIN) 100 MG capsule  2 times daily        01/16/21 1837             Karie Kirks 01/16/21 Industry, Wenda Overland, MD 01/17/21 [phone]

## 2021-01-16 NOTE — ED Notes (Signed)
Taken to xray.

## 2021-01-20 DIAGNOSIS — H2513 Age-related nuclear cataract, bilateral: Secondary | ICD-10-CM | POA: Diagnosis not present

## 2021-01-20 DIAGNOSIS — H04123 Dry eye syndrome of bilateral lacrimal glands: Secondary | ICD-10-CM | POA: Diagnosis not present

## 2021-01-20 LAB — ROCKY MTN SPOTTED FVR ABS PNL(IGG+IGM)
RMSF IgG: NEGATIVE
RMSF IgM: 0.44 index (ref 0.00–0.89)

## 2021-01-20 NOTE — Progress Notes (Addendum)
[facility] [place], [place], [place] [address] [phone]  Date:  01/21/2021   Name:  Paul Carlson   DOB:  [birth year]   MRN:  [id number]  PCP:  Darreld Mclean, MD    Chief Complaint: ER follow up  (Dehydration )   History of Present Illness:  Paul Carlson is a 75 y.o. very pleasant male patient who presents with the following: Last visit with myself in October - history of migraine headache, hypertension, hyperlipidemia Patient seen today for emergency room follow-up-our  History of migraine headache, hypertension, hyperlipidemia, GERD  He was seen in the ER on July 8 with concern of fatigue, chest tightness, perioral numbness and tingling He was evaluated and released to home with prescription for doxycycline Lab work from the ER showed minimal hyperkalemia, mild elevation of bilirubin, normal CBC Pt notes that he felt extremely tired in the am while he was shaving- he felt so bad that he went to the ER He was treated with doxy as he had been bitten by some ticks   Looking back he thinks he just overdid it and was exhausted and dehydrated- he was at 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  He was getting very little sleep, only 3 to 4 hours at night.  He notes that now he overall feels much better, and basically back to normal.  He did have a couple of minor lab abnormalities, increased creatinine and elevated bilirubin-in the ER-we can recheck these today Patient Active Problem List   Diagnosis Date Noted   Generalized abdominal pain 04/29/2020   History of hyperlipidemia 02/23/2016   Essential hypertension 02/23/2016   Migraine with aura and without status migrainosus, not intractable 02/19/2015    Past Medical History:  Diagnosis Date   Borderline hypertension    Cervical radiculopathy    Frequent headaches    GERD (gastroesophageal reflux disease)     Migraines     Past Surgical History:  Procedure Laterality Date   HERNIA REPAIR     LITHOTRIPSY      Social History   Tobacco Use   Smoking status: Never   Smokeless tobacco: Never  Substance Use Topics   Alcohol use: No    Family History  Problem Relation Age of Onset   Alzheimer's disease Father    Hypertension Father    Kidney disease Father    Arthritis Father    Cancer Paternal Grandfather        Prostate Cancer   Alcohol abuse Neg Hx     Allergies  Allergen Reactions   Azithromycin Other (See Comments)    Pt. reported that it gave him severe stomach cramps.   Pollen Extract Other (See Comments)    Pt. reports nasal congestion   Penicillins Rash    Medication list has been reviewed and updated.  Current Outpatient Medications on File Prior to Visit  Medication Sig Dispense Refill   acetaminophen (TYLENOL) 650 MG CR tablet Take 650 mg by mouth at bedtime as needed for pain.     aspirin-acetaminophen-caffeine (EXCEDRIN MIGRAINE) 250-250-65 MG tablet Take 1-2 tablets by mouth as needed     doxycycline (VIBRAMYCIN) 100 MG capsule Take 1 capsule (100 mg total) by mouth 2 (two) times daily for 7 days. 14 capsule 0   gabapentin (NEURONTIN) 400 MG capsule Take 3 capsules (1,200 mg total) by  mouth at bedtime. 270 capsule 1   ibuprofen (ADVIL,MOTRIN) 200 MG tablet Take 600 mg by mouth as needed.     lisinopril (ZESTRIL) 5 MG tablet TAKE 1 TABLET BY MOUTH EVERY DAY 90 tablet 3   pantoprazole (PROTONIX) 40 MG tablet Take 1 tablet (40 mg total) by mouth daily. 90 tablet 3   No current facility-administered medications on file prior to visit.    Review of Systems:  As per HPI- otherwise negative.  Pulse Readings from Last 3 Encounters:  01/21/21 (!) 58  01/16/21 (!) 53  07/10/20 67   BP Readings from Last 3 Encounters:  01/21/21 112/60  01/16/21 (!) 163/85  07/10/20 138/74   Wt Readings from Last 3 Encounters:  01/21/21 158 lb 9.6 oz (71.9 kg)  01/16/21 150  lb (68 kg)  07/10/20 162 lb 3.2 oz (73.6 kg)    Physical Examination: Vitals:   01/21/21 1339  BP: 112/60  Pulse: (!) 58  Temp: 98 F (36.7 C)  SpO2: 98%   Vitals:   01/21/21 1339  Weight: 158 lb 9.6 oz (71.9 kg)  Height: 5\' 7"  (1.702 m)   Body mass index is 24.84 kg/m. Ideal Body Weight: Weight in (lb) to have BMI = 25: 159.3  GEN: no acute distress. Looks well, normal weight  HEENT: Atraumatic, Normocephalic.  Ears and Nose: No external deformity. CV: RRR, No M/G/R. No JVD. No thrill. No extra heart sounds. PULM: CTA B, no wheezes, crackles, rhonchi. No retractions. No resp. distress. No accessory muscle use. ABD: S, NT, ND, +BS. No rebound. No HSM.  Belly is benign EXTR: No c/c/e PSYCH: Normally interactive. Conversant.    Assessment and Plan: Elevated serum creatinine - Plan: Comprehensive metabolic panel  Hospital discharge follow-up  Patient seen today for follow-up from recent ER evaluation.  He is feeling much better, thinks that he was just exhausted and dehydrated.  We discussed taking care to get enough rest, and ensuring good hydration especially during the summer months  Will check metabolic profile today and follow-up on minor lab abnormalities  He will let me know if any other symptoms or concerns  Signed Lamar Blinks, MD   Addendum 7/14, received labs as below-message to patient  Results for orders placed or performed in visit on 01/21/21  Comprehensive metabolic panel  Result Value Ref Range   Sodium 140 135 - 145 mEq/L   Potassium 5.0 3.5 - 5.1 mEq/L   Chloride 105 96 - 112 mEq/L   CO2 29 19 - 32 mEq/L   Glucose, Bld 77 70 - 99 mg/dL   BUN 19 6 - 23 mg/dL   Creatinine, Ser 1.33 0.40 - 1.50 mg/dL   Total Bilirubin 0.9 0.2 - 1.2 mg/dL   Alkaline Phosphatase 62 39 - 117 U/L   AST 19 0 - 37 U/L   ALT 15 0 - 53 U/L   Total Protein 6.5 6.0 - 8.3 g/dL   Albumin 4.3 3.5 - 5.2 g/dL   GFR 53.23 (L) >60.00 mL/min   Calcium 9.7 8.4 - 10.5  mg/dL

## 2021-01-21 ENCOUNTER — Encounter: Payer: Self-pay | Admitting: Family Medicine

## 2021-01-21 ENCOUNTER — Other Ambulatory Visit: Payer: Self-pay

## 2021-01-21 ENCOUNTER — Ambulatory Visit (INDEPENDENT_AMBULATORY_CARE_PROVIDER_SITE_OTHER): Payer: Medicare Other | Admitting: Family Medicine

## 2021-01-21 VITALS — BP 112/60 | HR 58 | Temp 98.0°F | Ht 67.0 in | Wt 158.6 lb

## 2021-01-21 DIAGNOSIS — Z09 Encounter for follow-up examination after completed treatment for conditions other than malignant neoplasm: Secondary | ICD-10-CM | POA: Diagnosis not present

## 2021-01-21 DIAGNOSIS — R7989 Other specified abnormal findings of blood chemistry: Secondary | ICD-10-CM | POA: Diagnosis not present

## 2021-01-21 NOTE — Patient Instructions (Signed)
It was good to see you again today, I am glad you are feeling better.  I will be in touch with your labs as soon as possible.  Please let me know if you have any other unusual symptoms or concerns

## 2021-01-22 ENCOUNTER — Encounter: Payer: Self-pay | Admitting: Family Medicine

## 2021-01-22 LAB — COMPREHENSIVE METABOLIC PANEL
ALT: 15 U/L (ref 0–53)
AST: 19 U/L (ref 0–37)
Albumin: 4.3 g/dL (ref 3.5–5.2)
Alkaline Phosphatase: 62 U/L (ref 39–117)
BUN: 19 mg/dL (ref 6–23)
CO2: 29 mEq/L (ref 19–32)
Calcium: 9.7 mg/dL (ref 8.4–10.5)
Chloride: 105 mEq/L (ref 96–112)
Creatinine, Ser: 1.33 mg/dL (ref 0.40–1.50)
GFR: 53.23 mL/min — ABNORMAL LOW (ref >60.00)
Glucose, Bld: 77 mg/dL (ref 70–99)
Potassium: 5 mEq/L (ref 3.5–5.1)
Sodium: 140 mEq/L (ref 135–145)
Total Bilirubin: 0.9 mg/dL (ref 0.2–1.2)
Total Protein: 6.5 g/dL (ref 6.0–8.3)

## 2021-01-30 LAB — LYME DISEASE, WESTERN BLOT
IgG P18 Ab.: ABSENT
IgG P23 Ab.: ABSENT
IgG P28 Ab.: ABSENT
IgG P30 Ab.: ABSENT
IgG P39 Ab.: ABSENT
IgG P45 Ab.: ABSENT
IgG P66 Ab.: ABSENT
IgG P93 Ab.: ABSENT
IgM P23 Ab.: ABSENT
IgM P39 Ab.: ABSENT
IgM P41 Ab.: ABSENT
Lyme IgG Wb: NEGATIVE
Lyme IgM Wb: NEGATIVE

## 2021-02-10 ENCOUNTER — Other Ambulatory Visit: Payer: Self-pay | Admitting: Family Medicine

## 2021-02-10 DIAGNOSIS — I10 Essential (primary) hypertension: Secondary | ICD-10-CM

## 2021-02-10 DIAGNOSIS — M5010 Cervical disc disorder with radiculopathy, unspecified cervical region: Secondary | ICD-10-CM

## 2021-02-24 DIAGNOSIS — N200 Calculus of kidney: Secondary | ICD-10-CM | POA: Diagnosis not present

## 2021-02-24 DIAGNOSIS — N3289 Other specified disorders of bladder: Secondary | ICD-10-CM | POA: Diagnosis not present

## 2021-02-24 DIAGNOSIS — N323 Diverticulum of bladder: Secondary | ICD-10-CM | POA: Diagnosis not present

## 2021-03-02 DIAGNOSIS — N201 Calculus of ureter: Secondary | ICD-10-CM | POA: Diagnosis not present

## 2021-03-02 DIAGNOSIS — N200 Calculus of kidney: Secondary | ICD-10-CM | POA: Diagnosis not present

## 2021-03-31 ENCOUNTER — Emergency Department (HOSPITAL_BASED_OUTPATIENT_CLINIC_OR_DEPARTMENT_OTHER): Payer: Medicare Other

## 2021-03-31 ENCOUNTER — Encounter: Payer: Self-pay | Admitting: Family Medicine

## 2021-03-31 ENCOUNTER — Telehealth: Payer: Medicare Other | Admitting: Nurse Practitioner

## 2021-03-31 ENCOUNTER — Other Ambulatory Visit: Payer: Self-pay

## 2021-03-31 ENCOUNTER — Encounter (HOSPITAL_BASED_OUTPATIENT_CLINIC_OR_DEPARTMENT_OTHER): Payer: Self-pay | Admitting: Emergency Medicine

## 2021-03-31 ENCOUNTER — Emergency Department (HOSPITAL_BASED_OUTPATIENT_CLINIC_OR_DEPARTMENT_OTHER)
Admission: EM | Admit: 2021-03-31 | Discharge: 2021-03-31 | Disposition: A | Discharge status: Home / Self Care | Payer: Medicare Other | Attending: Emergency Medicine | Admitting: Emergency Medicine

## 2021-03-31 ENCOUNTER — Ambulatory Visit
Admission: EM | Admit: 2021-03-31 | Discharge: 2021-03-31 | Disposition: A | Discharge status: Nursing Home (Includes ICF) | Payer: Medicare Other

## 2021-03-31 DIAGNOSIS — R112 Nausea with vomiting, unspecified: Secondary | ICD-10-CM

## 2021-03-31 DIAGNOSIS — N200 Calculus of kidney: Secondary | ICD-10-CM | POA: Insufficient documentation

## 2021-03-31 DIAGNOSIS — Z79899 Other long term (current) drug therapy: Secondary | ICD-10-CM | POA: Insufficient documentation

## 2021-03-31 DIAGNOSIS — N201 Calculus of ureter: Secondary | ICD-10-CM | POA: Insufficient documentation

## 2021-03-31 DIAGNOSIS — R197 Diarrhea, unspecified: Secondary | ICD-10-CM | POA: Diagnosis not present

## 2021-03-31 DIAGNOSIS — R1084 Generalized abdominal pain: Secondary | ICD-10-CM | POA: Diagnosis not present

## 2021-03-31 DIAGNOSIS — K219 Gastro-esophageal reflux disease without esophagitis: Secondary | ICD-10-CM | POA: Diagnosis not present

## 2021-03-31 DIAGNOSIS — Z20822 Contact with and (suspected) exposure to covid-19: Secondary | ICD-10-CM | POA: Diagnosis not present

## 2021-03-31 DIAGNOSIS — K529 Noninfective gastroenteritis and colitis, unspecified: Secondary | ICD-10-CM | POA: Diagnosis not present

## 2021-03-31 DIAGNOSIS — Z7982 Long term (current) use of aspirin: Secondary | ICD-10-CM | POA: Insufficient documentation

## 2021-03-31 DIAGNOSIS — N289 Disorder of kidney and ureter, unspecified: Secondary | ICD-10-CM | POA: Diagnosis not present

## 2021-03-31 DIAGNOSIS — I10 Essential (primary) hypertension: Secondary | ICD-10-CM | POA: Diagnosis not present

## 2021-03-31 DIAGNOSIS — K5730 Diverticulosis of large intestine without perforation or abscess without bleeding: Secondary | ICD-10-CM | POA: Diagnosis not present

## 2021-03-31 DIAGNOSIS — I700 Atherosclerosis of aorta: Secondary | ICD-10-CM | POA: Diagnosis not present

## 2021-03-31 LAB — CBC
HCT: 45.8 % (ref 39.0–52.0)
Hemoglobin: 15.4 g/dL (ref 13.0–17.0)
MCH: 30.5 pg (ref 26.0–34.0)
MCHC: 33.6 g/dL (ref 30.0–36.0)
MCV: 90.7 fL (ref 80.0–100.0)
Platelets: 216 10*3/uL (ref 150–400)
RBC: 5.05 MIL/uL (ref 4.22–5.81)
RDW: 13.8 % (ref 11.5–15.5)
WBC: 9.8 10*3/uL (ref 4.0–10.5)
nRBC: 0 % (ref 0.0–0.2)

## 2021-03-31 LAB — RESP PANEL BY RT-PCR (FLU A&B, COVID) ARPGX2
Influenza A by PCR: NEGATIVE
Influenza B by PCR: NEGATIVE
SARS Coronavirus 2 by RT PCR: NEGATIVE

## 2021-03-31 LAB — COMPREHENSIVE METABOLIC PANEL
ALT: 16 U/L (ref 0–44)
AST: 20 U/L (ref 15–41)
Albumin: 4.8 g/dL (ref 3.5–5.0)
Alkaline Phosphatase: 64 U/L (ref 38–126)
Anion gap: 12 (ref 5–15)
BUN: 12 mg/dL (ref 8–23)
CO2: 22 mmol/L (ref 22–32)
Calcium: 10.7 mg/dL — ABNORMAL HIGH (ref 8.9–10.3)
Chloride: 105 mmol/L (ref 98–111)
Creatinine, Ser: 1.21 mg/dL (ref 0.61–1.24)
GFR, Estimated: >60 mL/min (ref >60)
Glucose, Bld: 82 mg/dL (ref 70–99)
Potassium: 3.9 mmol/L (ref 3.5–5.1)
Sodium: 139 mmol/L (ref 135–145)
Total Bilirubin: 1.5 mg/dL — ABNORMAL HIGH (ref 0.3–1.2)
Total Protein: 7.7 g/dL (ref 6.5–8.1)

## 2021-03-31 LAB — LIPASE, BLOOD: Lipase: 15 U/L (ref 11–51)

## 2021-03-31 MED ORDER — METRONIDAZOLE 500 MG PO TABS: 500.0000 mg | ORAL_TABLET | Freq: Two times a day (BID) | ORAL | 0 refills | Status: DC

## 2021-03-31 MED ORDER — METRONIDAZOLE 500 MG PO TABS
500.0000 mg | ORAL_TABLET | Freq: Once | ORAL | Status: AC
  Administered 2021-03-31: 500 mg via ORAL
  Filled 2021-03-31: qty 1

## 2021-03-31 MED ORDER — CIPROFLOXACIN HCL 500 MG PO TABS: 500.0000 mg | ORAL_TABLET | Freq: Two times a day (BID) | ORAL | 0 refills | Status: DC

## 2021-03-31 MED ORDER — CIPROFLOXACIN HCL 500 MG PO TABS
500.0000 mg | ORAL_TABLET | Freq: Once | ORAL | Status: AC
  Administered 2021-03-31: 500 mg via ORAL
  Filled 2021-03-31: qty 1

## 2021-03-31 MED ORDER — HYDROCODONE-ACETAMINOPHEN 5-325 MG PO TABS: 1.0000 | ORAL_TABLET | ORAL | 0 refills | Status: DC | PRN

## 2021-03-31 MED ORDER — IOHEXOL 350 MG/ML SOLN
75.0000 mL | Freq: Once | INTRAVENOUS | Status: AC | PRN
  Administered 2021-03-31: 75 mL via INTRAVENOUS

## 2021-03-31 MED ORDER — SODIUM CHLORIDE 0.9 % IV BOLUS
1000.0000 mL | Freq: Once | INTRAVENOUS | Status: AC
Start: 2021-03-31 — End: 2021-03-31
  Administered 2021-03-31: 1000 mL via INTRAVENOUS

## 2021-03-31 MED ORDER — ONDANSETRON 4 MG PO TBDP: 4.0000 mg | ORAL_TABLET | Freq: Three times a day (TID) | ORAL | 0 refills | Status: DC | PRN

## 2021-03-31 NOTE — Progress Notes (Signed)
Based on what you shared with me, I feel your condition warrants further evaluation and I recommend that you be seen in a face to face visit.   NOTE: There will be NO CHARGE for this eVisit  Due to your age, recent hospitalization, and the decreased amount of urine you are making along with inability to eat proper nutrition for multiple days we feel it is best you are seen in person to assess for dehydration.   Below are some local Urgent Care facilities you can be seen at same day.   If you have any questions please let us know.    If you are having a true medical emergency please call 911.      For an urgent face to face visit, [facility] has six urgent care centers for your convenience:     [facility] [place] Get Driving Directions [phone] [place], [place] [address]    [facility]) Get Driving Directions [phone] [place], [place] [address]  [facility]) Get Driving Directions [phone] [address] [place],  [place]  [address]  [facility] Get Driving Directions [phone] [place], [place], [place] [address]   [facility] Get Driving Directions  [phone] [address], [place] [address]   [facility] at [place] Get Driving Directions [phone] [address] [place], [place] [address]  Your MyChart E-visit questionnaire answers were reviewed by a board certified advanced clinical practitioner to complete your personal care plan based on your specific symptoms.  Thank you for using e-Visits.     I spent approximately 7 minutes reviewing the patient's history, current symptoms and coordinating their plan of care today.

## 2021-03-31 NOTE — Discharge Instructions (Addendum)
Please go the hospital as soon as you leave urgent care for further evaluation and management.

## 2021-03-31 NOTE — ED Provider Notes (Signed)
[facility] Provider Note   CSN: [id number] Arrival date & time: 03/31/21  1451     History Chief Complaint  Patient presents with   Abdominal Pain   Diarrhea    Paul Carlson is a 75 y.o. male.  Pt presents to the ED today with abdominal pain and diarrhea.  Pt said he developed sx on 9/18 after a pot luck.  Pt will often get diarrhea when he eats foods that are not his normal, but it usually goes away after a day.  He continues to have severe diarrhea for 3 days now.  This is unusual.  He feels nauseous.  He denies f/c.  He did have 1 episode of blood in his stool today.  He has taken imodium without relief.       Past Medical History:  Diagnosis Date   Borderline hypertension    Cervical radiculopathy    Frequent headaches    GERD (gastroesophageal reflux disease)    Migraines     Patient Active Problem List   Diagnosis Date Noted   Bilateral nephrolithiasis 02/24/2021   Generalized abdominal pain 04/29/2020   Hypoxia 10/17/2018   Bilateral arm numbness and tingling while sleeping 07/20/2017   History of hyperlipidemia 02/23/2016   Essential hypertension 02/23/2016   Migraine with aura and without status migrainosus, not intractable 02/19/2015    Past Surgical History:  Procedure Laterality Date   HERNIA REPAIR     LITHOTRIPSY         Family History  Problem Relation Age of Onset   Alzheimer's disease Father    Hypertension Father    Kidney disease Father    Arthritis Father    Cancer Paternal Grandfather        Prostate Cancer   Alcohol abuse Neg Hx     Social History   Tobacco Use   Smoking status: Never   Smokeless tobacco: Never  Substance Use Topics   Alcohol use: No    Home Medications Prior to Admission medications   Medication Sig Start Date End Date Taking? Authorizing Provider  ciprofloxacin (CIPRO) 500 MG tablet Take 1 tablet (500 mg total) by mouth 2 (two) times daily. 03/31/21  Yes Isla Pence, MD   HYDROcodone-acetaminophen (NORCO/VICODIN) 5-325 MG tablet Take 1 tablet by mouth every 4 (four) hours as needed. 03/31/21  Yes Isla Pence, MD  metroNIDAZOLE (FLAGYL) 500 MG tablet Take 1 tablet (500 mg total) by mouth 2 (two) times daily. 03/31/21  Yes Isla Pence, MD  ondansetron (ZOFRAN ODT) 4 MG disintegrating tablet Take 1 tablet (4 mg total) by mouth every 8 (eight) hours as needed for nausea or vomiting. 03/31/21  Yes Isla Pence, MD  acetaminophen (TYLENOL) 650 MG CR tablet Take 650 mg by mouth at bedtime as needed for pain.    [provider]  aspirin-acetaminophen-caffeine (EXCEDRIN MIGRAINE) [phone] MG tablet Take 1-2 tablets by mouth as needed    [provider]  gabapentin (NEURONTIN) 400 MG capsule TAKE 3 CAPSULES BY MOUTH AT BEDTIME 02/10/21   Copland, Gay Filler, MD  ibuprofen (ADVIL,MOTRIN) 200 MG tablet Take 600 mg by mouth as needed.    [provider]  lisinopril (ZESTRIL) 5 MG tablet TAKE 1 TABLET BY MOUTH EVERY DAY 02/10/21   Copland, Gay Filler, MD  ondansetron (ZOFRAN) 4 MG tablet Take by mouth. 03/02/21   [provider]  pantoprazole (PROTONIX) 40 MG tablet Take 1 tablet (40 mg total) by mouth daily. 07/21/20   Copland,  Gay Filler, MD  tamsulosin (FLOMAX) 0.4 MG CAPS capsule Take 0.4 mg by mouth daily. 03/02/21   [provider]    Allergies    Azithromycin, Pollen extract, and Penicillins  Review of Systems   Review of Systems  Gastrointestinal:  Positive for abdominal pain and diarrhea.  All other systems reviewed and are negative.  Physical Exam Updated Vital Signs BP (!) 142/75 (BP Location: Right Arm)   Pulse 76   Temp 98.1 F (36.7 C) (Oral)   Resp 18   Ht 5\' 7"  (1.702 m)   Wt 70.8 kg   SpO2 100%   BMI 24.43 kg/m   Physical Exam Vitals and nursing note reviewed.  Constitutional:      Appearance: He is well-developed.  HENT:     Head: Normocephalic and atraumatic.     Mouth/Throat:     Mouth:  Mucous membranes are dry.     Pharynx: Oropharynx is clear.  Eyes:     Extraocular Movements: Extraocular movements intact.     Pupils: Pupils are equal, round, and reactive to light.  Cardiovascular:     Rate and Rhythm: Normal rate and regular rhythm.  Pulmonary:     Effort: Pulmonary effort is normal.     Breath sounds: Normal breath sounds.  Abdominal:     General: Abdomen is flat. Bowel sounds are normal.     Palpations: Abdomen is soft.     Tenderness: There is generalized abdominal tenderness.  Skin:    General: Skin is warm.     Capillary Refill: Capillary refill takes less than 2 seconds.  Neurological:     General: No focal deficit present.     Mental Status: He is alert and oriented to person, place, and time.  Psychiatric:        Mood and Affect: Mood normal.        Behavior: Behavior normal.    ED Results / Procedures / Treatments   Labs (all labs ordered are listed, but only abnormal results are displayed) Labs Reviewed  COMPREHENSIVE METABOLIC PANEL - Abnormal; Notable for the following components:      Result Value   Calcium 10.7 (*)    Total Bilirubin 1.5 (*)    All other components within normal limits  RESP PANEL BY RT-PCR (FLU A&B, COVID) ARPGX2  LIPASE, BLOOD  CBC  URINALYSIS, ROUTINE W REFLEX MICROSCOPIC    EKG None  Radiology CT ABDOMEN PELVIS W CONTRAST  Result Date: 03/31/2021 CLINICAL DATA:  Two day history of abdominal pain and diarrhea. EXAM: CT ABDOMEN AND PELVIS WITH CONTRAST TECHNIQUE: Multidetector CT imaging of the abdomen and pelvis was performed using the standard protocol following bolus administration of intravenous contrast. CONTRAST:  62mL OMNIPAQUE IOHEXOL 350 MG/ML SOLN COMPARISON:  05/05/2020 FINDINGS: Lower chest: The lung bases are clear of acute process. No pleural effusion or pulmonary lesions. The heart is normal in size. No pericardial effusion. The distal esophagus and aorta are unremarkable. Hepatobiliary: No hepatic  lesions or intrahepatic biliary dilatation. The gallbladder is unremarkable. No common bile duct dilatation. Pancreas: No mass, inflammation or ductal dilatation. Spleen: Normal size.  No focal lesions. Adrenals/Urinary Tract: The adrenal glands are unremarkable. Left renal calculi are noted.  No right-sided renal calculi. There is a 5 mm calculus at the left UPJ with associated mild hydronephrosis. No distal ureteral calculi. No right ureteral calculi. No bladder calculi. Small bilateral renal cysts are noted. No worrisome renal lesions. Mild trabeculation of the bladder. Small left-sided  bladder diverticulum. Stomach/Bowel: The stomach, duodenum and small bowel are unremarkable. No acute inflammatory process, mass lesions or obstructive findings. The terminal ileum and appendix are normal. There is a diffuse inflammatory or infectious colitis with mucosal and serosal enhancement and submucosal edema. C difficile colitis would be a possibility. Significant descending colon and sigmoid colon diverticulosis without findings for acute diverticulitis. Vascular/Lymphatic: Moderate atherosclerotic calcifications involving the aorta and iliac arteries and branch vessels. No aneurysm or dissection. The major venous structures are patent. No mesenteric or retroperitoneal mass or adenopathy. Scattered mesenteric and retroperitoneal lymph nodes likely reactive and due to the colitis. Reproductive: Prostate gland and seminal vesicles are unremarkable. Other: No pelvic mass or adenopathy. No free pelvic fluid collections. No inguinal mass or adenopathy. No abdominal wall hernia or subcutaneous lesions. Musculoskeletal: No significant bony findings. IMPRESSION: 1. 5 mm left UPJ calculus with associated mild hydronephrosis. 2. Diffuse inflammatory or infectious colitis. 3. Left renal calculi. 4. Significant descending colon and sigmoid colon diverticulosis without findings for acute diverticulitis. 5. Aortic atherosclerosis.  Aortic Atherosclerosis (ICD10-I70.0). Electronically Signed   By: Marijo Sanes M.D.   On: 03/31/2021 21:51    Procedures Procedures   Medications Ordered in ED Medications  ciprofloxacin (CIPRO) tablet 500 mg (has no administration in time range)  metroNIDAZOLE (FLAGYL) tablet 500 mg (has no administration in time range)  sodium chloride 0.9 % bolus 1,000 mL (1,000 mLs Intravenous New Bag/Given 03/31/21 2018)  iohexol (OMNIPAQUE) 350 MG/ML injection 75 mL (75 mLs Intravenous Contrast Given 03/31/21 2108)    ED Course  I have reviewed the triage vital signs and the nursing notes.  Pertinent labs & imaging results that were available during my care of the patient were reviewed by me and considered in my medical decision making (see chart for details).    MDM Rules/Calculators/A&P                           Pt has not had diarrhea to send for a stool sample since he's been in his room.  He is doing better after fluids.  CT shows diffuse colitis.  I will start him on cipro/flagyl.  Pt does have a ureteral stone.  He said he gets them all the time and is not bothered much by them.  Pt is to return if worse.  F/u with pcp/gi.  Final Clinical Impression(s) / ED Diagnoses Final diagnoses:  Colitis  Renal stone    Rx / DC Orders ED Discharge Orders          Ordered    ciprofloxacin (CIPRO) 500 MG tablet  2 times daily        03/31/21 2222    metroNIDAZOLE (FLAGYL) 500 MG tablet  2 times daily        03/31/21 2222    HYDROcodone-acetaminophen (NORCO/VICODIN) 5-325 MG tablet  Every 4 hours PRN        03/31/21 2222    ondansetron (ZOFRAN ODT) 4 MG disintegrating tablet  Every 8 hours PRN        03/31/21 2222    Ambulatory referral to Gastroenterology        03/31/21 2224             Isla Pence, MD 03/31/21 2225

## 2021-03-31 NOTE — ED Triage Notes (Signed)
Pt arrives to ED with c/o of LLQ abdominal pain x3 days 9/18. Pt reports >20 episodes of diarrhea since 9/18. Abd pain is dull and is intermittent. Has had decrease po intake. OTC imodium without relief. One episode of bloody diarrhea today.

## 2021-03-31 NOTE — ED Provider Notes (Signed)
[name]    CSN: [id number] Arrival date & time: 03/31/21  1215      History   Chief Complaint Chief Complaint  Patient presents with   Diarrhea   Abdominal Pain    HPI Paul Carlson is a 75 y.o. male.   Patient presents with 2-3 history of diarrhea, nausea, lower abdominal pain.  Has only been able to drink half a cup of coffee and some cranberry juice mixed with water.  Has been taking Imodium with no improvement in symptoms.  Denies noticing any blood in stool.  Has nausea but no vomiting.  Had a decrease in appetite as well.  Patient also reports decreased in frequency of urination.  Denies any known sick contacts.  Denies any known fevers.  Denies any upper respiratory symptoms, chest pain, shortness of breath.   Diarrhea Abdominal Pain  Past Medical History:  Diagnosis Date   Borderline hypertension    Cervical radiculopathy    Frequent headaches    GERD (gastroesophageal reflux disease)    Migraines     Patient Active Problem List   Diagnosis Date Noted   Bilateral nephrolithiasis 02/24/2021   Generalized abdominal pain 04/29/2020   Hypoxia 10/17/2018   Bilateral arm numbness and tingling while sleeping 07/20/2017   History of hyperlipidemia 02/23/2016   Essential hypertension 02/23/2016   Migraine with aura and without status migrainosus, not intractable 02/19/2015    Past Surgical History:  Procedure Laterality Date   HERNIA REPAIR     LITHOTRIPSY         Home Medications    Prior to Admission medications   Medication Sig Start Date End Date Taking? Authorizing Provider  acetaminophen (TYLENOL) 650 MG CR tablet Take 650 mg by mouth at bedtime as needed for pain.    [provider]  aspirin-acetaminophen-caffeine (EXCEDRIN MIGRAINE) [phone] MG tablet Take 1-2 tablets by mouth as needed    [provider]  gabapentin (NEURONTIN) 400 MG capsule TAKE 3 CAPSULES BY MOUTH AT BEDTIME 02/10/21   Copland, Gay Filler, MD   HYDROcodone-acetaminophen (NORCO/VICODIN) 5-325 MG tablet Take 1 tablet by mouth every 6 (six) hours as needed. 03/02/21   [provider]  ibuprofen (ADVIL,MOTRIN) 200 MG tablet Take 600 mg by mouth as needed.    [provider]  lisinopril (ZESTRIL) 5 MG tablet TAKE 1 TABLET BY MOUTH EVERY DAY 02/10/21   Copland, Gay Filler, MD  ondansetron (ZOFRAN) 4 MG tablet Take by mouth. 03/02/21   [provider]  pantoprazole (PROTONIX) 40 MG tablet Take 1 tablet (40 mg total) by mouth daily. 07/21/20   Copland, Gay Filler, MD  tamsulosin (FLOMAX) 0.4 MG CAPS capsule Take 0.4 mg by mouth daily. 03/02/21   [provider]    Family History Family History  Problem Relation Age of Onset   Alzheimer's disease Father    Hypertension Father    Kidney disease Father    Arthritis Father    Cancer Paternal Grandfather        Prostate Cancer   Alcohol abuse Neg Hx     Social History Social History   Tobacco Use   Smoking status: Never   Smokeless tobacco: Never  Substance Use Topics   Alcohol use: No     Allergies   Azithromycin, Pollen extract, and Penicillins   Review of Systems Review of Systems  Per HPI Physical Exam Triage Vital Signs ED Triage Vitals  Enc Vitals Group     BP 03/31/21 1329  132/78     Pulse Rate 03/31/21 1329 76     Resp 03/31/21 1329 18     Temp 03/31/21 1329 98 F (36.7 C)     Temp Source 03/31/21 1329 Oral     SpO2 03/31/21 1329 98 %     Weight --      Height --      Head Circumference --      Peak Flow --      Pain Score 03/31/21 1332 2     Pain Loc --      Pain Edu? --      Excl. in [place]? --    No data found.  Updated Vital Signs BP 132/78 (BP Location: Left Arm)   Pulse 76   Temp 98 F (36.7 C) (Oral)   Resp 18   SpO2 98%   Visual Acuity Right Eye Distance:   Left Eye Distance:   Bilateral Distance:    Right Eye Near:   Left Eye Near:    Bilateral Near:     Physical Exam Constitutional:      General:  He is not in acute distress.    Appearance: Normal appearance. He is not toxic-appearing or diaphoretic.  HENT:     Head: Normocephalic and atraumatic.     Mouth/Throat:     Mouth: Mucous membranes are dry.     Pharynx: No posterior oropharyngeal erythema.  Eyes:     Extraocular Movements: Extraocular movements intact.     Conjunctiva/sclera: Conjunctivae normal.  Cardiovascular:     Rate and Rhythm: Normal rate and regular rhythm.     Pulses: Normal pulses.     Heart sounds: Normal heart sounds.  Pulmonary:     Effort: Pulmonary effort is normal.     Breath sounds: Normal breath sounds.  Abdominal:     General: Abdomen is flat. Bowel sounds are normal. There is no distension.     Palpations: Abdomen is soft.     Tenderness: There is abdominal tenderness in the left lower quadrant. There is no guarding. Negative signs include Murphy's sign and McBurney's sign.  Skin:    General: Skin is warm and dry.  Neurological:     General: No focal deficit present.     Mental Status: He is alert and oriented to person, place, and time. Mental status is at baseline.  Psychiatric:        Mood and Affect: Mood normal.        Behavior: Behavior normal.        Thought Content: Thought content normal.        Judgment: Judgment normal.     UC Treatments / Results  Labs (all labs ordered are listed, but only abnormal results are displayed) Labs Reviewed - No data to display  EKG   Radiology No results found.  Procedures Procedures (including critical care time)  Medications Ordered in UC Medications - No data to display  Initial Impression / Assessment and Plan / UC Course  I have reviewed the triage vital signs and the nursing notes.  Pertinent labs & imaging results that were available during my care of the patient were reviewed by me and considered in my medical decision making (see chart for details).     Patient has multiple risk factors for dehydration including decrease  in urinary frequency, age, decrease in p.o. liquid intake.  Advised patient to go to the hospital for further evaluation and management as patient may need IV fluids.  Unable to provide IV fluids at urgent care due to the monitoring that it requires.  Patient was agreeable with plan.  Vital signs stable at discharge.  Agree with patient self transport to the hospital. Final Clinical Impressions(s) / UC Diagnoses   Final diagnoses:  Diarrhea, unspecified type  Generalized abdominal pain     Discharge Instructions      Please go the hospital as soon as you leave urgent care for further evaluation and management.     ED Prescriptions   None    PDMP not reviewed this encounter.   Odis Luster, FNP 03/31/21 1432

## 2021-03-31 NOTE — ED Notes (Signed)
Patient transported to CT 

## 2021-03-31 NOTE — ED Triage Notes (Signed)
Two day h/o diarrhea with a burning sensation in his lower abdomen. Has been taking imodium without a decrease in the urge to defecate. Pt notes a decrease in appetite and PO liquids. Denies emesis but notes some nausea.

## 2021-04-01 ENCOUNTER — Telehealth: Payer: Self-pay | Admitting: Family Medicine

## 2021-04-01 NOTE — Telephone Encounter (Signed)
Patient called and stated he received a telephone call. However there was no encounters on patient chart. If anyone had any questions or concerns please call.

## 2021-04-14 DIAGNOSIS — N3289 Other specified disorders of bladder: Secondary | ICD-10-CM | POA: Diagnosis not present

## 2021-04-14 DIAGNOSIS — M6289 Other specified disorders of muscle: Secondary | ICD-10-CM | POA: Diagnosis not present

## 2021-04-14 DIAGNOSIS — N200 Calculus of kidney: Secondary | ICD-10-CM | POA: Diagnosis not present

## 2021-04-14 DIAGNOSIS — Z87442 Personal history of urinary calculi: Secondary | ICD-10-CM | POA: Diagnosis not present

## 2021-05-07 DIAGNOSIS — N200 Calculus of kidney: Secondary | ICD-10-CM | POA: Diagnosis not present

## 2021-06-15 ENCOUNTER — Encounter: Payer: Self-pay | Admitting: Emergency Medicine

## 2021-07-05 DIAGNOSIS — R059 Cough, unspecified: Secondary | ICD-10-CM | POA: Diagnosis not present

## 2021-07-05 DIAGNOSIS — U071 COVID-19: Secondary | ICD-10-CM | POA: Diagnosis not present

## 2021-07-05 DIAGNOSIS — R52 Pain, unspecified: Secondary | ICD-10-CM | POA: Diagnosis not present

## 2021-07-07 ENCOUNTER — Telehealth: Payer: Self-pay

## 2021-07-07 NOTE — Telephone Encounter (Signed)
Pt was seen at [facility] and was prescribed:  Nirmatrelvir&Ritonavir 300/100 (Paxlovid, 300/100,) 20 x 150 MG & 10 x 100MG  tablet therapy pack   Indications: COVID-19- Take 1 Dose pack by mouth 2 (two) times a day for 5 days.

## 2021-07-07 NOTE — Telephone Encounter (Signed)
Nurse Assessment Nurse: Glean Salvo, RN, Magda Paganini Date/Time Paul Carlson Time): 07/04/2021 9:33:18 AM Confirm and document reason for call. If symptomatic, describe symptoms. ---Caller reports that he has headache, congestion, cough, sore throat, and body aches. No fever. Does the patient have any new or worsening symptoms? ---Yes Will a triage be completed? ---Yes Related visit to physician within the last 2 weeks? ---No Does the PT have any chronic conditions? (i.e. diabetes, asthma, this includes High risk factors for pregnancy, etc.) ---Yes List chronic conditions. ---Neck/Shoulder and arm pain. Intestinal issues. HTN Is this a behavioral health or substance abuse call? ---No Guidelines Guideline Title Affirmed Question Affirmed Notes Nurse Date/Time (Eastern Time) Cough - Acute NonProductive Taking an ACE Inhibitor medication (e.g., benazepril/ LOTENSIN, captopril/CAPOTEN, enalapril/VASOTEC, lisinopril/ZESTRIL) Glean Salvo, RN, Magda Paganini 07/04/2021 9:37:04 AM Disp. Time Paul Carlson Time) Disposition Final User 07/04/2021 9:49:07 AM SEE PCP WITHIN 3 DAYS Yes Glean Salvo, RN, Magda Paganini PLEASE NOTE: All timestamps contained within this report are represented as [place] Standard Time. CONFIDENTIALTY NOTICE: This fax transmission is intended only for the addressee. It contains information that is legally privileged, confidential or otherwise protected from use or disclosure. If you are not the intended recipient, you are strictly prohibited from reviewing, disclosing, copying using or disseminating any of this information or taking any action in reliance on or regarding this information. If you have received this fax in error, please notify us immediately by telephone so that we can arrange for its return to [place]. Phone: [phone], Toll-Free: [phone], Fax: [phone] Page: 2 of 2 Call Id: [id number] [place] Disagree/Comply Comply Caller Understands Yes PreDisposition Call Doctor Care Advice Given  Per Guideline SEE PCP WITHIN 3 DAYS: * You need to be seen within 2 or 3 days. * PCP VISIT: Call your doctor (or NP/PA) during regular office hours and make an appointment. A clinic or urgent care center are good places to go for care if your doctor's office is closed or you can't get an appointment. NOTE: If office will be open tomorrow, tell caller to call then, not in 3 days. CARE ADVICE given per Cough - Acute Non-Productive (Adult) guideline. CALL BACK IF: * Difficulty breathing occurs * You become worse

## 2021-07-07 NOTE — Telephone Encounter (Signed)
Pt seen at [facility] on 07/05/21

## 2021-07-07 NOTE — Telephone Encounter (Signed)
urse Assessment Nurse: Kathi Ludwig, RN, Leana Roe Date/Time Eilene Ghazi Time): 07/04/2021 12:59:21 PM Confirm and document reason for call. If symptomatic, describe symptoms. ---Caller states home covid positive. no chest pain or dyspnea. leg pain, cough, headache, temp 99 this morning. spoke with triage nurse earlier Does the patient have any new or worsening symptoms? ---Yes Will a triage be completed? ---Yes Related visit to physician within the last 2 weeks? ---No Does the PT have any chronic conditions? (i.e. diabetes, asthma, this includes High risk factors for pregnancy, etc.) ---No Is this a behavioral health or substance abuse call? ---No Guidelines Guideline Title Affirmed Question Affirmed Notes Nurse Date/Time ([place] Time) COVID-19 - Diagnosed or Suspected [1] HIGH RISK for severe COVID complications (e.g., weak immune system, age > 64 years, obesity with BMI 30 or higher, pregnant, chronic Kathi Ludwig, RN, Leana Roe 07/04/2021 1:02:44 PM PLEASE NOTE: All timestamps contained within this report are represented as [place] Standard Time. CONFIDENTIALTY NOTICE: This fax transmission is intended only for the addressee. It contains information that is legally privileged, confidential or otherwise protected from use or disclosure. If you are not the intended recipient, you are strictly prohibited from reviewing, disclosing, copying using or disseminating any of this information or taking any action in reliance on or regarding this information. If you have received this fax in error, please notify us immediately by telephone so that we can arrange for its return to [place]. Phone: [phone], Toll-Free: [phone], Fax: [phone] Page: 2 of 3 Call Id: [id number] Guidelines Guideline Title Affirmed Question Affirmed Notes Nurse Date/Time Eilene Ghazi Time) lung disease or other chronic medical condition) AND [2] COVID symptoms (e.g., cough, fever) (Exceptions: Already seen by PCP  and no new or worsening symptoms.) Disp. Time Eilene Ghazi Time) Disposition Final User 07/04/2021 1:12:08 PM See HCP within 4 Hours (or PCP triage) Yes Kathi Ludwig, RN, Leana Roe Disposition Overriden: Call PCP within 24 Hours Override Reason: Patients symptoms need a higher level of care Caller Disagree/Comply Comply Caller Understands Yes PreDisposition InappropriateToAsk Care Advice Given Per Guideline SEE HCP (OR PCP TRIAGE) WITHIN 4 HOURS: * IF OFFICE WILL BE CLOSED AND NO PCP (PRIMARY CARE PROVIDER) SECOND-LEVEL TRIAGE: You need to be seen within the next 3 or 4 hours. A nearby Urgent Care Center [facility]) is often a good source of care. Another choice is to go to the ED. Go sooner if you become worse. * The symptoms are generally treated the same whether you have COVID-19, influenza or some other respiratory virus. GENERAL CARE ADVICE FOR COVID-19 SYMPTOMS: * Cough: Use cough drops. * Feeling dehydrated: Drink extra liquids. If the air in your home is dry, use a humidifier. * Fever: For fever over 101 F (38.3 C), take acetaminophen every 4 to 6 hours (Adults 650 mg) OR ibuprofen every 6 to 8 hours (Adults 400 mg). Before taking any medicine, read all the instructions on the package. Do not take aspirin unless your doctor has prescribed it for you. * Muscle aches, headache, and other pains: Often this comes and goes with the fever. Take acetaminophen every 4 to 6 hours (Adults 650 mg) OR ibuprofen every 6 to 8 hours (Adults 400 mg). Before taking any medicine, read all the instructions on the package. * Sore throat: Try throat lozenges, hard candy or warm chicken broth. CALL BACK IF: * You become worse CARE ADVICE given per COVID-19 - DIAGNOSED OR SUSPECTED (Adult) guideline. * STAY HOME A MINIMUM OF 5 DAYS: People with MILD COVID-19 can STOP HOME ISOLATION AFTER 5 DAYS if (1)  fever has been gone for 24 hours (without using fever medicine) AND (2) symptoms are better. Continue to wear a  well-fitted mask for a full 10 days when around others. COVID-19 - HOW TO PROTECT OTHERS - WHEN YOU ARE SICK WITH COVID-19: * COUGH SYRUP WITH DEXTROMETHORPHAN: An over-the-counter cough syrup can help your cough. The most common cough suppressant in over-the-counter cough medicines is dextromethorphan. COUGH MEDICINES: FEVER MEDICINES: * For fevers above 101 F (38.3 C) take either acetaminophen or ibuprofen. * They are over-the-counter (OTC) drugs that help treat both fever and pain. You can buy them at the drugstore. * The goal of fever therapy is to bring the fever down to a comfortable level. Remember that fever medicine usually lowers fever 2 degrees F (1 - 1 1/2 degrees C). After Care Instructions Given Call Event Type User Date / Time Description Education document email Milus Banister 07/04/2021 1:11:28 PM COVID-19 Diagnosed or Suspected PLEASE NOTE: All timestamps contained within this report are represented as [place] Standard Time. CONFIDENTIALTY NOTICE: This fax transmission is intended only for the addressee. It contains information that is legally privileged, confidential or otherwise protected from use or disclosure. If you are not the intended recipient, you are strictly prohibited from reviewing, disclosing, copying using or disseminating any of this information or taking any action in reliance on or regarding this information. If you have received this fax in error, please notify us immediately by telephone so that we can arrange for its return to [place]. Phone: [phone], Toll-Free: [phone], Fax: [phone] Page: 3 of 3 Call Id: [id number] Comments User: Estevan Ryder, RN Date/Time Eilene Ghazi Time): 07/04/2021 1:15:14 PM referred to UC to be eval for Paxlovid Referrals GO TO FACILITY OTHER - SPECIFY

## 2021-07-16 ENCOUNTER — Ambulatory Visit: Payer: Medicare Other

## 2021-07-23 ENCOUNTER — Ambulatory Visit (INDEPENDENT_AMBULATORY_CARE_PROVIDER_SITE_OTHER): Payer: Medicare Other

## 2021-07-23 VITALS — BP 122/70 | HR 63 | Temp 98.1°F | Resp 16 | Ht 67.0 in | Wt 161.2 lb

## 2021-07-23 DIAGNOSIS — Z0000 Encounter for general adult medical examination without abnormal findings: Secondary | ICD-10-CM | POA: Diagnosis not present

## 2021-07-23 NOTE — Patient Instructions (Signed)
Paul Carlson , Thank you for taking time to come for your Medicare Wellness Visit. I appreciate your ongoing commitment to your health goals. Please review the following plan we discussed and let me know if I can assist you in the future.   Screening recommendations/referrals: Colonoscopy: Per our conversation, you will call your GI doctor to find out when to repeat colonoscopy. Recommended yearly ophthalmology/optometry visit for glaucoma screening and checkup Recommended yearly dental visit for hygiene and checkup  Vaccinations: Influenza vaccine: Declined Pneumococcal vaccine: Declined Tdap vaccine: Up to date Shingles vaccine: May obtain vaccine at your local pharmacy. Covid-19:Booster available at the pharmacy  Advanced directives: Information given today  Conditions/risks identified: See problem list  Next appointment: Follow up in one year for your annual wellness visit. 07/26/2022 @ 9:00.  Preventive Care 74 Years and Older, Male Preventive care refers to lifestyle choices and visits with your health care provider that can promote health and wellness. What does preventive care include? A yearly physical exam. This is also called an annual well check. Dental exams once or twice a year. Routine eye exams. Ask your health care provider how often you should have your eyes checked. Personal lifestyle choices, including: Daily care of your teeth and gums. Regular physical activity. Eating a healthy diet. Avoiding tobacco and drug use. Limiting alcohol use. Practicing safe sex. Taking low doses of aspirin every day. Taking vitamin and mineral supplements as recommended by your health care provider. What happens during an annual well check? The services and screenings done by your health care provider during your annual well check will depend on your age, overall health, lifestyle risk factors, and family history of disease. Counseling  Your health care provider may ask you  questions about your: Alcohol use. Tobacco use. Drug use. Emotional well-being. Home and relationship well-being. Sexual activity. Eating habits. History of falls. Memory and ability to understand (cognition). Work and work Statistician. Screening  You may have the following tests or measurements: Height, weight, and BMI. Blood pressure. Lipid and cholesterol levels. These may be checked every 5 years, or more frequently if you are over 75 years old. Skin check. Lung cancer screening. You may have this screening every year starting at age 37 if you have a 30-pack-year history of smoking and currently smoke or have quit within the past 15 years. Fecal occult blood test (FOBT) of the stool. You may have this test every year starting at age 62. Flexible sigmoidoscopy or colonoscopy. You may have a sigmoidoscopy every 5 years or a colonoscopy every 10 years starting at age 42. Prostate cancer screening. Recommendations will vary depending on your family history and other risks. Hepatitis C blood test. Hepatitis B blood test. Sexually transmitted disease (STD) testing. Diabetes screening. This is done by checking your blood sugar (glucose) after you have not eaten for a while (fasting). You may have this done every 1-3 years. Abdominal aortic aneurysm (AAA) screening. You may need this if you are a current or former smoker. Osteoporosis. You may be screened starting at age 31 if you are at high risk. Talk with your health care provider about your test results, treatment options, and if necessary, the need for more tests. Vaccines  Your health care provider may recommend certain vaccines, such as: Influenza vaccine. This is recommended every year. Tetanus, diphtheria, and acellular pertussis (Tdap, Td) vaccine. You may need a Td booster every 10 years. Zoster vaccine. You may need this after age 11. Pneumococcal 13-valent conjugate (PCV13) vaccine.  One dose is recommended after age  39. Pneumococcal polysaccharide (PPSV23) vaccine. One dose is recommended after age 67. Talk to your health care provider about which screenings and vaccines you need and how often you need them. This information is not intended to replace advice given to you by your health care provider. Make sure you discuss any questions you have with your health care provider. Document Released: 07/25/2015 Document Revised: 03/17/2016 Document Reviewed: 04/29/2015 Elsevier Interactive Patient Education  2017 [place] Prevention in the Home Falls can cause injuries. They can happen to people of all ages. There are many things you can do to make your home safe and to help prevent falls. What can I do on the outside of my home? Regularly fix the edges of walkways and driveways and fix any cracks. Remove anything that might make you trip as you walk through a door, such as a raised step or threshold. Trim any bushes or trees on the path to your home. Use bright outdoor lighting. Clear any walking paths of anything that might make someone trip, such as rocks or tools. Regularly check to see if handrails are loose or broken. Make sure that both sides of any steps have handrails. Any raised decks and porches should have guardrails on the edges. Have any leaves, snow, or ice cleared regularly. Use sand or salt on walking paths during winter. Clean up any spills in your garage right away. This includes oil or grease spills. What can I do in the bathroom? Use night lights. Install grab bars by the toilet and in the tub and shower. Do not use towel bars as grab bars. Use non-skid mats or decals in the tub or shower. If you need to sit down in the shower, use a plastic, non-slip stool. Keep the floor dry. Clean up any water that spills on the floor as soon as it happens. Remove soap buildup in the tub or shower regularly. Attach bath mats securely with double-sided non-slip rug tape. Do not have throw  rugs and other things on the floor that can make you trip. What can I do in the bedroom? Use night lights. Make sure that you have a light by your bed that is easy to reach. Do not use any sheets or blankets that are too big for your bed. They should not hang down onto the floor. Have a firm chair that has side arms. You can use this for support while you get dressed. Do not have throw rugs and other things on the floor that can make you trip. What can I do in the kitchen? Clean up any spills right away. Avoid walking on wet floors. Keep items that you use a lot in easy-to-reach places. If you need to reach something above you, use a strong step stool that has a grab bar. Keep electrical cords out of the way. Do not use floor polish or wax that makes floors slippery. If you must use wax, use non-skid floor wax. Do not have throw rugs and other things on the floor that can make you trip. What can I do with my stairs? Do not leave any items on the stairs. Make sure that there are handrails on both sides of the stairs and use them. Fix handrails that are broken or loose. Make sure that handrails are as long as the stairways. Check any carpeting to make sure that it is firmly attached to the stairs. Fix any carpet that is loose or  worn. Avoid having throw rugs at the top or bottom of the stairs. If you do have throw rugs, attach them to the floor with carpet tape. Make sure that you have a light switch at the top of the stairs and the bottom of the stairs. If you do not have them, ask someone to add them for you. What else can I do to help prevent falls? Wear shoes that: Do not have high heels. Have rubber bottoms. Are comfortable and fit you well. Are closed at the toe. Do not wear sandals. If you use a stepladder: Make sure that it is fully opened. Do not climb a closed stepladder. Make sure that both sides of the stepladder are locked into place. Ask someone to hold it for you, if  possible. Clearly mark and make sure that you can see: Any grab bars or handrails. First and last steps. Where the edge of each step is. Use tools that help you move around (mobility aids) if they are needed. These include: Canes. Walkers. Scooters. Crutches. Turn on the lights when you go into a dark area. Replace any light bulbs as soon as they burn out. Set up your furniture so you have a clear path. Avoid moving your furniture around. If any of your floors are uneven, fix them. If there are any pets around you, be aware of where they are. Review your medicines with your doctor. Some medicines can make you feel dizzy. This can increase your chance of falling. Ask your doctor what other things that you can do to help prevent falls. This information is not intended to replace advice given to you by your health care provider. Make sure you discuss any questions you have with your health care provider. Document Released: 04/24/2009 Document Revised: 12/04/2015 Document Reviewed: 08/02/2014 Elsevier Interactive Patient Education  2017 [employer].

## 2021-07-23 NOTE — Progress Notes (Signed)
Subjective:   Paul Carlson is a 76 y.o. male who presents for Medicare Annual/Subsequent preventive examination.     Review of Systems     Cardiac Risk Factors include: advanced age (>12men, >46 women);hypertension;male gender;dyslipidemia     Objective:    Today's Vitals   07/23/21 1501  BP: 122/70  Pulse: 63  Resp: 16  Temp: 98.1 F (36.7 C)  TempSrc: Oral  SpO2: 96%  Weight: 161 lb 3.2 oz (73.1 kg)  Height: 5\' 7"  (1.702 m)   Body mass index is 25.25 kg/m.  Advanced Directives 07/23/2021 03/31/2021 01/16/2021 07/10/2020 06/29/2019 05/25/2018  Does Patient Have a Medical Advance Directive? No No No No No No  Would patient like information on creating a medical advance directive? Yes (MAU/Ambulatory/Procedural Areas - Information given) - No - Patient declined Yes (MAU/Ambulatory/Procedural Areas - Information given) No - Patient declined Yes (MAU/Ambulatory/Procedural Areas - Information given)    Current Medications (verified) Outpatient Encounter Medications as of 07/23/2021  Medication Sig   acetaminophen (TYLENOL) 650 MG CR tablet Take 650 mg by mouth at bedtime as needed for pain.   aspirin-acetaminophen-caffeine (EXCEDRIN MIGRAINE) 250-250-65 MG tablet Take 1-2 tablets by mouth as needed   gabapentin (NEURONTIN) 400 MG capsule TAKE 3 CAPSULES BY MOUTH AT BEDTIME   ibuprofen (ADVIL,MOTRIN) 200 MG tablet Take 600 mg by mouth as needed.   lisinopril (ZESTRIL) 5 MG tablet TAKE 1 TABLET BY MOUTH EVERY DAY   pantoprazole (PROTONIX) 40 MG tablet Take 1 tablet (40 mg total) by mouth daily.   HYDROcodone-acetaminophen (NORCO/VICODIN) 5-325 MG tablet Take 1 tablet by mouth every 4 (four) hours as needed. (Patient not taking: Reported on 07/23/2021)   tamsulosin (FLOMAX) 0.4 MG CAPS capsule Take 0.4 mg by mouth daily. (Patient not taking: Reported on 07/23/2021)   [DISCONTINUED] ciprofloxacin (CIPRO) 500 MG tablet Take 1 tablet (500 mg total) by mouth 2 (two) times daily.    [DISCONTINUED] metroNIDAZOLE (FLAGYL) 500 MG tablet Take 1 tablet (500 mg total) by mouth 2 (two) times daily.   [DISCONTINUED] ondansetron (ZOFRAN ODT) 4 MG disintegrating tablet Take 1 tablet (4 mg total) by mouth every 8 (eight) hours as needed for nausea or vomiting.   [DISCONTINUED] ondansetron (ZOFRAN) 4 MG tablet Take by mouth.   No facility-administered encounter medications on file as of 07/23/2021.    Allergies (verified) Azithromycin, Pollen extract, and Penicillins   History: Past Medical History:  Diagnosis Date   Borderline hypertension    Cervical radiculopathy    Frequent headaches    GERD (gastroesophageal reflux disease)    Migraines    Past Surgical History:  Procedure Laterality Date   HERNIA REPAIR     LITHOTRIPSY     Family History  Problem Relation Age of Onset   Alzheimer's disease Father    Hypertension Father    Kidney disease Father    Arthritis Father    Cancer Paternal Grandfather        Prostate Cancer   Alcohol abuse Neg Hx    Social History   Socioeconomic History   Marital status: Married    Spouse name: Not on file   Number of children: Not on file   Years of education: Not on file   Highest education level: Not on file  Occupational History   Occupation: Pastor  Tobacco Use   Smoking status: Never   Smokeless tobacco: Never  Substance and Sexual Activity   Alcohol use: No   Drug use: Not on file  Sexual activity: Not on file  Other Topics Concern   Not on file  Social History Narrative   Not on file   Social Determinants of Health   Financial Resource Strain: Low Risk    Difficulty of Paying Living Expenses: Not hard at all  Food Insecurity: No Food Insecurity   Worried About Running Out of Food in the Last Year: Never true   [place] in the Last Year: Never true  Transportation Needs: No Transportation Needs   Lack of Transportation (Medical): No   Lack of Transportation (Non-Medical): No  Physical Activity:  Inactive   Days of Exercise per Week: 0 days   Minutes of Exercise per Session: 0 min  Stress: No Stress Concern Present   Feeling of Stress : Not at all  Social Connections: Moderately Integrated   Frequency of Communication with Friends and Family: More than three times a week   Frequency of Social Gatherings with Friends and Family: More than three times a week   Attends Religious Services: More than 4 times per year   Active Member of [employer] or Organizations: No   Attends Music therapist: Never   Marital Status: Married    Tobacco Counseling Counseling given: Yes   Clinical Intake:  Pre-visit preparation completed: Yes  Pain : No/denies pain     BMI - recorded: 25.25 Nutritional Status: BMI 25 -29 Overweight Nutritional Risks: None Diabetes: No  How often do you need to have someone help you when you read instructions, pamphlets, or other written materials from your doctor or pharmacy?: 1 - Never  Diabetic?No  Interpreter Needed?: No  Information entered by :: Caroleen Hamman LPn   Activities of Daily Living In your present state of health, do you have any difficulty performing the following activities: 07/23/2021  Hearing? N  Vision? N  Difficulty concentrating or making decisions? N  Walking or climbing stairs? N  Dressing or bathing? N  Doing errands, shopping? N  Preparing Food and eating ? N  Using the Toilet? N  In the past six months, have you accidently leaked urine? N  Do you have problems with loss of bowel control? N  Managing your Medications? N  Managing your Finances? N  Housekeeping or managing your Housekeeping? N  Some recent data might be hidden    Patient Care Team: Copland, Gay Filler, MD as PCP - General (Family Medicine)  Indicate any recent Medical Services you may have received from other than [facility] providers in the past year (date may be approximate).     Assessment:   This is a routine wellness examination for  Paul Carlson.  Hearing/Vision screen Hearing Screening - Comments:: C/o mild hearing loss Vision Screening - Comments:: Last eye exam-2022  Dietary issues and exercise activities discussed: Current Exercise Habits: The patient does not participate in regular exercise at present, Exercise limited by: None identified   Goals Addressed             This Visit's Progress    DIET - INCREASE WATER INTAKE   On track      Depression Screen PHQ 2/9 Scores 07/23/2021 01/21/2021 07/10/2020 06/29/2019 05/25/2018 08/24/2017  PHQ - 2 Score 0 0 0 0 0 0    Fall Risk Fall Risk  07/23/2021 01/21/2021 07/10/2020 06/29/2019 05/25/2018  Falls in the past year? 0 0 0 0 1  Number falls in past yr: 0 0 0 - 0  Injury with Fall? 0 0 0 - 0  Risk for fall due to : - No Fall Risks - - -  Follow up Falls prevention discussed Falls evaluation completed Falls prevention discussed Education provided;Falls prevention discussed -    FALL RISK PREVENTION PERTAINING TO THE HOME:  Any stairs in or around the home? No  Home free of loose throw rugs in walkways, pet beds, electrical cords, etc? Yes  Adequate lighting in your home to reduce risk of falls? Yes   ASSISTIVE DEVICES UTILIZED TO PREVENT FALLS:  Life alert? No  Use of a cane, walker or w/c? No  Grab bars in the bathroom? No  Shower chair or bench in shower? No  Elevated toilet seat or a handicapped toilet? No   TIMED UP AND GO:  Was the test performed? Yes .  Length of time to ambulate 10 feet: 10 sec.   Gait steady and fast without use of assistive device  Cognitive Function:Normal cognitive status assessed by direct observation by this Nurse Health Advisor. No abnormalities found.          Immunizations Immunization History  Administered Date(s) Administered   PFIZER(Purple Top)SARS-COV-2 Vaccination 10/01/2019, 11/08/2019, 06/23/2020   Tdap 02/19/2015    TDAP status: Up to date  Flu Vaccine status: Declined, Education has been provided  regarding the importance of this vaccine but patient still declined. Advised may receive this vaccine at local pharmacy or Health Dept. Aware to provide a copy of the vaccination record if obtained from local pharmacy or Health Dept. Verbalized acceptance and understanding.  Pneumococcal vaccine status: Declined,  Education has been provided regarding the importance of this vaccine but patient still declined. Advised may receive this vaccine at local pharmacy or Health Dept. Aware to provide a copy of the vaccination record if obtained from local pharmacy or Health Dept. Verbalized acceptance and understanding.   Covid-19 vaccine status: Information provided on how to obtain vaccines.   Qualifies for Shingles Vaccine? Yes   Zostavax completed No   Shingrix Completed?: No.    Education has been provided regarding the importance of this vaccine. Patient has been advised to call insurance company to determine out of pocket expense if they have not yet received this vaccine. Advised may also receive vaccine at local pharmacy or Health Dept. Verbalized acceptance and understanding.  Screening Tests Health Maintenance  Topic Date Due   Zoster Vaccines- Shingrix (1 of 2) Never done   Pneumonia Vaccine 17+ Years old (1 - PCV) Never done   COVID-19 Vaccine (4 - Booster for Pfizer series) 08/18/2020   COLONOSCOPY (Pts 45-48yrs Insurance coverage will need to be confirmed)  11/08/2020   INFLUENZA VACCINE  Never done   TETANUS/TDAP  02/18/2025   Hepatitis C Screening  Completed   HPV VACCINES  Aged Out    Health Maintenance  Health Maintenance Due  Topic Date Due   Zoster Vaccines- Shingrix (1 of 2) Never done   Pneumonia Vaccine 1+ Years old (1 - PCV) Never done   COVID-19 Vaccine (4 - Booster for [place] series) 08/18/2020   COLONOSCOPY (Pts 45-62yrs Insurance coverage will need to be confirmed)  11/08/2020   INFLUENZA VACCINE  Never done    Colorectal cancer screening: Patient plans to call  his GI doctor to see when to repeat colonoscopy.  Lung Cancer Screening: (Low Dose CT Chest recommended if Age 21-80 years, 30 pack-year currently smoking OR have quit w/in 15years.) does not qualify.     Additional Screening:  Hepatitis C Screening: Completed 08/29/2017  Vision Screening:  Recommended annual ophthalmology exams for early detection of glaucoma and other disorders of the eye. Is the patient up to date with their annual eye exam?  Yes  Who is the provider or what is the name of the office in which the patient attends annual eye exams? Pt unsure of name   Dental Screening: Recommended annual dental exams for proper oral hygiene  Community Resource Referral / Chronic Care Management: CRR required this visit?  No   CCM required this visit?  No      Plan:     I have personally reviewed and noted the following in the patients chart:   Medical and social history Use of alcohol, tobacco or illicit drugs  Current medications and supplements including opioid prescriptions. Patient is not currently taking opioid prescriptions. Functional ability and status Nutritional status Physical activity Advanced directives List of other physicians Hospitalizations, surgeries, and ER visits in previous 12 months Vitals Screenings to include cognitive, depression, and falls Referrals and appointments  In addition, I have reviewed and discussed with patient certain preventive protocols, quality metrics, and best practice recommendations. A written personalized care plan for preventive services as well as general preventive health recommendations were provided to patient.   Patient would like to access avs on mychart.  Marta Antu, LPN   12/16/3014  Nurse Health Advisor  Nurse Notes: None

## 2021-07-29 ENCOUNTER — Encounter: Payer: Self-pay | Admitting: Family Medicine

## 2021-07-29 ENCOUNTER — Telehealth: Payer: Medicare Other | Admitting: Physician Assistant

## 2021-07-29 DIAGNOSIS — R6889 Other general symptoms and signs: Secondary | ICD-10-CM | POA: Diagnosis not present

## 2021-07-29 MED ORDER — OSELTAMIVIR PHOSPHATE 30 MG PO CAPS: 30.0000 mg | ORAL_CAPSULE | Freq: Two times a day (BID) | ORAL | 0 refills | Status: AC

## 2021-07-29 NOTE — Progress Notes (Signed)
E visit for Flu like symptoms   We are sorry that you are not feeling well.  Here is how we plan to help! Based on what you have shared with me it looks like you may have a respiratory virus that may be influenza.  Influenza or the flu is   an infection caused by a respiratory virus. The flu virus is highly contagious and persons who did not receive their yearly flu vaccination may catch the flu from close contact.  We have anti-viral medications to treat the viruses that cause this infection. They are not a cure and only shorten the course of the infection. These prescriptions are most effective when they are given within the first 2 days of flu symptoms. Antiviral medication are indicated if you have a high risk of complications from the flu. You should  also consider an antiviral medication if you are in close contact with someone who is at risk. These medications can help patients avoid complications from the flu  but have side effects that you should know. Possible side effects from Tamiflu or oseltamivir include nausea, vomiting, diarrhea, dizziness, headaches, eye redness, sleep problems or other respiratory symptoms. You should not take Tamiflu if you have an allergy to oseltamivir or any to the ingredients in Tamiflu.  Based upon your symptoms and potential risk factors I have prescribed Oseltamivir (Tamiflu).  It has been sent to your designated pharmacy.  You will take one 30 mg capsule orally twice a day for the next 5 days. and I recommend that you follow the flu symptoms recommendation that I have listed below.  ANYONE WHO HAS FLU SYMPTOMS SHOULD: Stay home. The flu is highly contagious and going out or to work exposes others! Be sure to drink plenty of fluids. Water is fine as well as fruit juices, sodas and electrolyte beverages. You may want to stay away from caffeine or alcohol. If you are nauseated, try taking small sips of liquids. How do you know if you are getting enough  fluid? Your urine should be a pale yellow or almost colorless. Get rest. Taking a steamy shower or using a humidifier may help nasal congestion and ease sore throat pain. Using a saline nasal spray works much the same way. Cough drops, hard candies and sore throat lozenges may ease your cough. Line up a caregiver. Have someone check on you regularly.   GET HELP RIGHT AWAY IF: You cannot keep down liquids or your medications. You become short of breath Your fell like you are going to pass out or loose consciousness. Your symptoms persist after you have completed your treatment plan MAKE SURE YOU  Understand these instructions. Will watch your condition. Will get help right away if you are not doing well or get worse.  Your e-visit answers were reviewed by a board certified advanced clinical practitioner to complete your personal care plan.  Depending on the condition, your plan could have included both over the counter or prescription medications.  If there is a problem please reply  once you have received a response from your provider.  Your safety is important to [place].  If you have drug allergies check your prescription carefully.    You can use MyChart to ask questions about todays visit, request a non-urgent call back, or ask for a work or school excuse for 24 hours related to this e-Visit. If it has been greater than 24 hours you will need to follow up with your provider, or enter a  new e-Visit to address those concerns.  You will get an e-mail in the next two days asking about your experience.  I hope that your e-visit has been valuable and will speed your recovery. Thank you for using e-visits.

## 2021-07-29 NOTE — Telephone Encounter (Signed)
See e-visit encounter, Einar Pheasant has answered there.

## 2021-07-29 NOTE — Progress Notes (Signed)
I have spent 5 minutes in review of e-visit questionnaire, review and updating patient chart, medical decision making and response to patient.   Jasraj Lappe Cody Kamyah Wilhelmsen, PA-C    

## 2021-08-04 DIAGNOSIS — N200 Calculus of kidney: Secondary | ICD-10-CM | POA: Diagnosis not present

## 2021-08-17 ENCOUNTER — Other Ambulatory Visit: Payer: Self-pay | Admitting: Family Medicine

## 2021-08-17 DIAGNOSIS — M5010 Cervical disc disorder with radiculopathy, unspecified cervical region: Secondary | ICD-10-CM

## 2021-09-18 ENCOUNTER — Other Ambulatory Visit: Payer: Self-pay | Admitting: Family Medicine

## 2021-09-18 DIAGNOSIS — M5010 Cervical disc disorder with radiculopathy, unspecified cervical region: Secondary | ICD-10-CM

## 2021-09-21 ENCOUNTER — Ambulatory Visit (INDEPENDENT_AMBULATORY_CARE_PROVIDER_SITE_OTHER): Payer: Medicare Other | Admitting: Family Medicine

## 2021-09-21 ENCOUNTER — Ambulatory Visit: Payer: Medicare Other | Admitting: Family Medicine

## 2021-09-21 ENCOUNTER — Encounter: Payer: Self-pay | Admitting: Family Medicine

## 2021-09-21 DIAGNOSIS — E875 Hyperkalemia: Secondary | ICD-10-CM | POA: Diagnosis not present

## 2021-09-21 DIAGNOSIS — Z23 Encounter for immunization: Secondary | ICD-10-CM

## 2021-09-21 DIAGNOSIS — M5010 Cervical disc disorder with radiculopathy, unspecified cervical region: Secondary | ICD-10-CM | POA: Diagnosis not present

## 2021-09-21 DIAGNOSIS — Z1211 Encounter for screening for malignant neoplasm of colon: Secondary | ICD-10-CM

## 2021-09-21 LAB — COMPREHENSIVE METABOLIC PANEL
ALT: 15 U/L (ref 0–53)
AST: 18 U/L (ref 0–37)
Albumin: 4.4 g/dL (ref 3.5–5.2)
Alkaline Phosphatase: 66 U/L (ref 39–117)
BUN: 17 mg/dL (ref 6–23)
CO2: 30 mEq/L (ref 19–32)
Calcium: 10 mg/dL (ref 8.4–10.5)
Chloride: 104 mEq/L (ref 96–112)
Creatinine, Ser: 1.27 mg/dL (ref 0.40–1.50)
GFR: 56 mL/min — ABNORMAL LOW (ref >60.00)
Glucose, Bld: 103 mg/dL — ABNORMAL HIGH (ref 70–99)
Potassium: 5.6 mEq/L — ABNORMAL HIGH (ref 3.5–5.1)
Sodium: 141 mEq/L (ref 135–145)
Total Bilirubin: 0.5 mg/dL (ref 0.2–1.2)
Total Protein: 6.6 g/dL (ref 6.0–8.3)

## 2021-09-21 MED ORDER — GABAPENTIN 400 MG PO CAPS: 1200.0000 mg | ORAL_CAPSULE | Freq: Every day | ORAL | 1 refills | Status: DC

## 2021-09-21 NOTE — Addendum Note (Signed)
Addended by: Lamar Blinks C on: 09/21/2021 08:15 PM ? ? Modules accepted: Orders ? ?

## 2021-09-21 NOTE — Patient Instructions (Signed)
It was good to see you today ?Pneumonia vaccine given today ?I would also encourage you to consider being vaccinated against shingles at your convenience- through your pharmacy  ?Referral placed to GI for a colonoscopy- [facility] in [place]  ?Address: [address], [place], [place] [address] ?Phone: [phone] ? ?I will be in touch with your labs asap  ?

## 2021-09-21 NOTE — Progress Notes (Addendum)
Therapist, music at [facility] ?[address], [address] ?[place], [place] [address] ?336 [phone] ?Fax [phone] ? ?Date:  09/21/2021  ? ?Name:  Paul Carlson   DOB:  1946-07-02   MRN:  [id number] ? ?PCP:  Darreld Mclean, MD  ? ? ?Chief Complaint: Medication Refill (Concerns/ questions: /) ? ? ?History of Present Illness: ? ?Paul Carlson is a 76 y.o. very pleasant male patient who presents with the following: ? ?Pt seen today for follow-up- last seen by myself in July. History of migraine headache, hypertension, hyperlipidemia, prostatitis and nephrolithiasis ? ?Colon cancer screening- pt is on a 3 year plan and is overdue- ordered for him today.  His last colonoscopy was done in [facility], but he is not quite sure of the provider or office.  I placed a referral to [facility] ?Pneumonia vaccine- do today ?Shingrix - recommend at pharmacy  ?Most recent labs in September - calcium high, needs repeat today  ? ?He was recently seen by urology with a kidney stone  ?He had lithotripsy which was successful ? ?He conitnues to take gabapentin at bedtime- this does help him with his neck pain.  He would like to continue using this medication ? ?Patient Active Problem List  ? Diagnosis Date Noted  ? Bilateral nephrolithiasis 02/24/2021  ? Generalized abdominal pain 04/29/2020  ? Hypoxia 10/17/2018  ? Bilateral arm numbness and tingling while sleeping 07/20/2017  ? History of hyperlipidemia 02/23/2016  ? Essential hypertension 02/23/2016  ? Migraine with aura and without status migrainosus, not intractable 02/19/2015  ? ? ?Past Medical History:  ?Diagnosis Date  ? Borderline hypertension   ? Cervical radiculopathy   ? Frequent headaches   ? GERD (gastroesophageal reflux disease)   ? Migraines   ? ? ?Past Surgical History:  ?Procedure Laterality Date  ? HERNIA REPAIR    ? LITHOTRIPSY    ? ? ?Social History  ? ?Tobacco Use  ? Smoking status: Never  ? Smokeless tobacco: Never  ?Substance Use Topics   ? Alcohol use: No  ? ? ?Family History  ?Problem Relation Age of Onset  ? Alzheimer's disease Father   ? Hypertension Father   ? Kidney disease Father   ? Arthritis Father   ? Cancer Paternal Grandfather   ?     Prostate Cancer  ? Alcohol abuse Neg Hx   ? ? ?Allergies  ?Allergen Reactions  ? Azithromycin Other (See Comments)  ?  Pt. reported that it gave him severe stomach cramps.  ? Pollen Extract Other (See Comments)  ?  Pt. reports nasal congestion  ? Penicillins Rash  ? ? ?Medication list has been reviewed and updated. ? ?Current Outpatient Medications on File Prior to Visit  ?Medication Sig Dispense Refill  ? acetaminophen (TYLENOL) 650 MG CR tablet Take 650 mg by mouth at bedtime as needed for pain.    ? aspirin-acetaminophen-caffeine (EXCEDRIN MIGRAINE) 250-250-65 MG tablet Take 1-2 tablets by mouth as needed    ? gabapentin (NEURONTIN) 400 MG capsule Take 3 capsules (1,200 mg total) by mouth at bedtime. 90 capsule 0  ? ibuprofen (ADVIL,MOTRIN) 200 MG tablet Take 600 mg by mouth as needed.    ? lisinopril (ZESTRIL) 5 MG tablet TAKE 1 TABLET BY MOUTH EVERY DAY 90 tablet 3  ? pantoprazole (PROTONIX) 40 MG tablet Take 1 tablet (40 mg total) by mouth daily. 90 tablet 3  ? ?No current facility-administered medications on file prior to  visit.  ? ? ?Review of Systems: ? ?As per HPI- otherwise negative. ? ? ?Physical Examination: ?Vitals:  ? 09/21/21 0959  ?BP: 122/60  ?Pulse: (!) 55  ?Resp: 18  ?Temp: 97.7 ?F (36.5 ?C)  ?SpO2: 99%  ? ?Vitals:  ? 09/21/21 0959  ?Weight: 157 lb 6.4 oz (71.4 kg)  ?Height: '5\' 7"'$  (1.702 m)  ? ?Body mass index is 24.65 kg/m?. ?Ideal Body Weight: Weight in (lb) to have BMI = 25: 159.3 ? ?GEN: no acute distress.  Normal weight, looks well ?HEENT: Atraumatic, Normocephalic.  ?Ears and Nose: No external deformity. ?CV: RRR, No M/G/R. No JVD. No thrill. No extra heart sounds. ?PULM: CTA B, no wheezes, crackles, rhonchi. No retractions. No resp. distress. No accessory muscle use. ?ABD: S, NT,  ND. No rebound. No HSM. ?EXTR: No c/c/e ?PSYCH: Normally interactive. Conversant.  ? ? ?Assessment and Plan: ?Hypercalcemia - Plan: Comprehensive metabolic panel, PTH, intact (no Ca) ? ?Cervical disc disorder with radiculopathy of cervical region - Plan: gabapentin (NEURONTIN) 400 MG capsule ? ?Immunization due - Plan: Pneumococcal conjugate vaccine 20-valent (Prevnar 20) ? ?Screening for colon cancer - Plan: Ambulatory referral to Gastroenterology ? ?Follow-up on hypercalcemia today with metabolic profile and PTH ?Refill gabapentin ?Update pneumonia vaccine ?Referral placed to GI ? ?Will plan further follow- up pending labs. ?Plan to visit in about 6 months ? ?Signed ?Lamar Blinks, MD ? ? ?Received labs as below, message to patient ? ?Results for orders placed or performed in visit on 09/21/21  ?Comprehensive metabolic panel  ?Result Value Ref Range  ? Sodium 141 135 - 145 mEq/L  ? Potassium 5.6 No hemolysis seen (H) 3.5 - 5.1 mEq/L  ? Chloride 104 96 - 112 mEq/L  ? CO2 30 19 - 32 mEq/L  ? Glucose, Bld 103 (H) 70 - 99 mg/dL  ? BUN 17 6 - 23 mg/dL  ? Creatinine, Ser 1.27 0.40 - 1.50 mg/dL  ? Total Bilirubin 0.5 0.2 - 1.2 mg/dL  ? Alkaline Phosphatase 66 39 - 117 U/L  ? AST 18 0 - 37 U/L  ? ALT 15 0 - 53 U/L  ? Total Protein 6.6 6.0 - 8.3 g/dL  ? Albumin 4.4 3.5 - 5.2 g/dL  ? GFR 56.00 (L) >60.00 mL/min  ? Calcium 10.0 8.4 - 10.5 mg/dL  ? ? ? ?

## 2021-09-22 LAB — PARATHYROID HORMONE, INTACT (NO CA): PTH: 35 pg/mL (ref 16–77)

## 2021-11-04 ENCOUNTER — Other Ambulatory Visit: Payer: Self-pay | Admitting: Family Medicine

## 2021-11-04 DIAGNOSIS — R1084 Generalized abdominal pain: Secondary | ICD-10-CM

## 2022-02-11 IMAGING — CT CT RENAL STONE PROTOCOL
2 of 4 series · 16 of 46 positions shown, 18 images · non-contrast
Comparison: None.

CLINICAL DATA: Testicular pain with hematuria

EXAM:
CT ABDOMEN AND PELVIS WITHOUT CONTRAST
TECHNIQUE: Multidetector CT imaging of the abdomen and pelvis was performed
following the standard protocol without IV contrast.

[Series 2: axial st · axial · 0.72mm/px · z∈[+554,+948]mm · 13 of 87 slices shown, 15 images]
[im 4/87  soft-tissue]
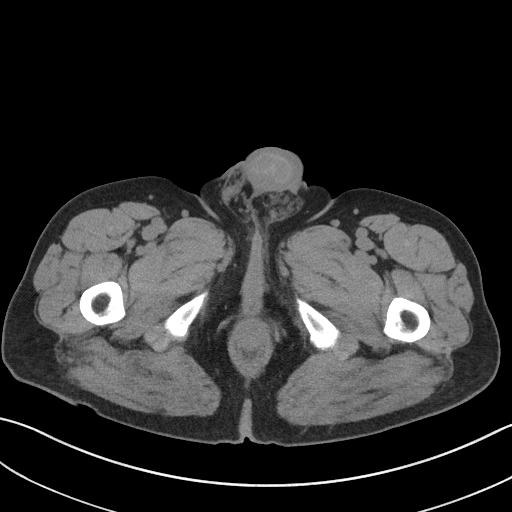
[im 4/87  bone]
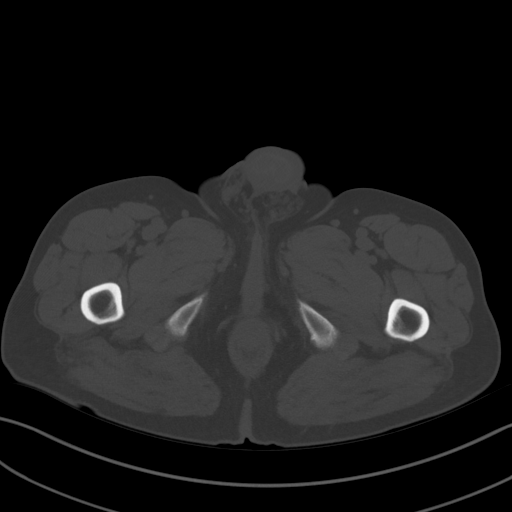
[im 11/87  soft-tissue]
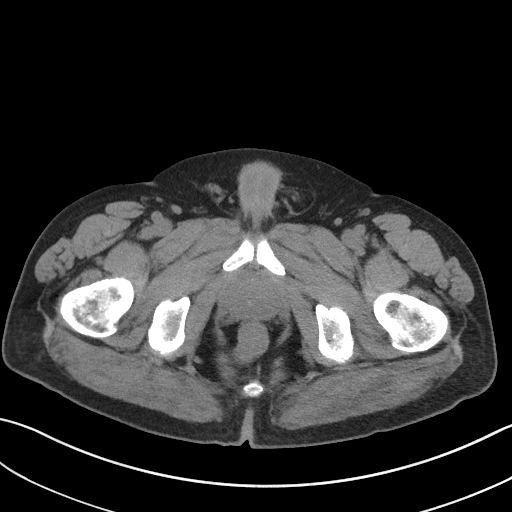
[im 18/87  soft-tissue]
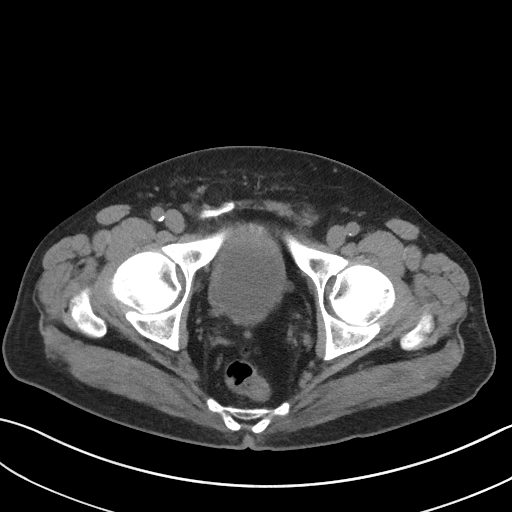
[im 26/87  soft-tissue]
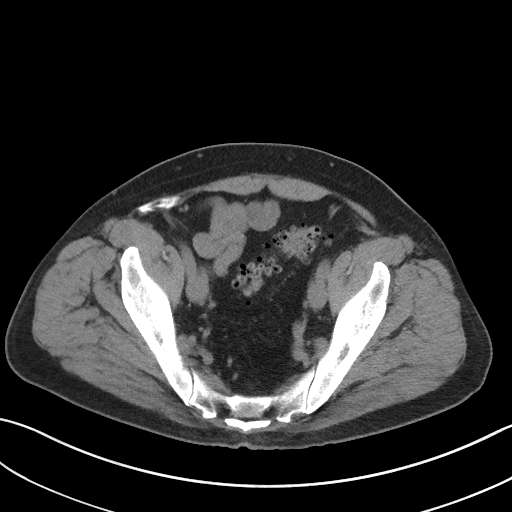
[im 29/87  soft-tissue]
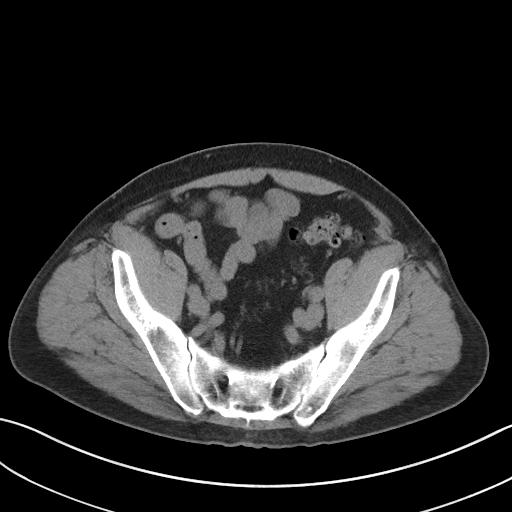
[im 36/87  soft-tissue]
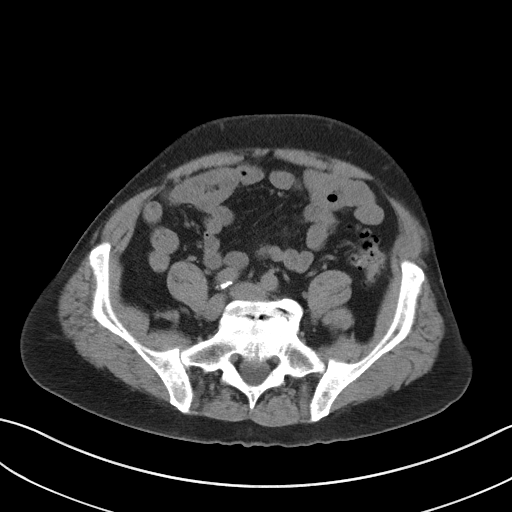
[im 44/87  soft-tissue]
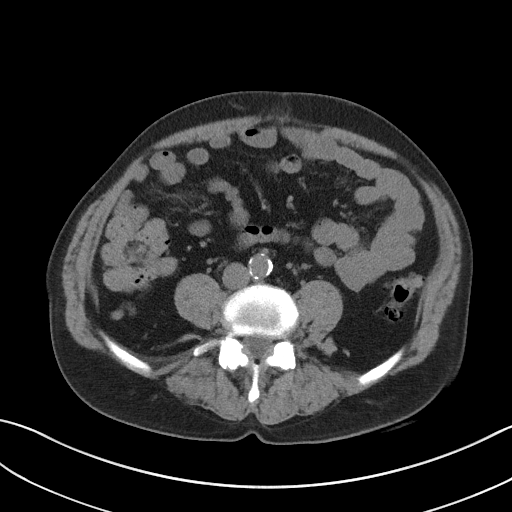
[im 51/87  soft-tissue]
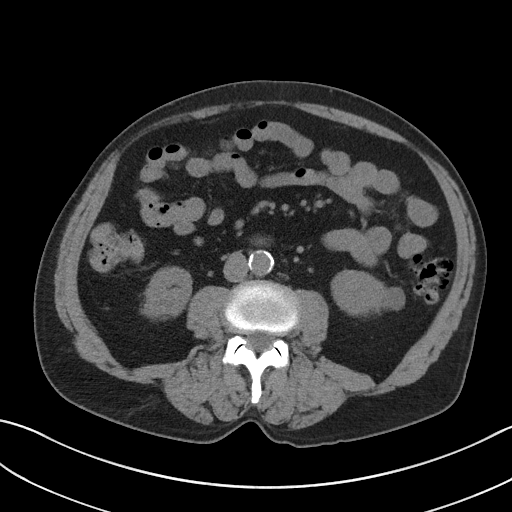
[im 58/87  soft-tissue]
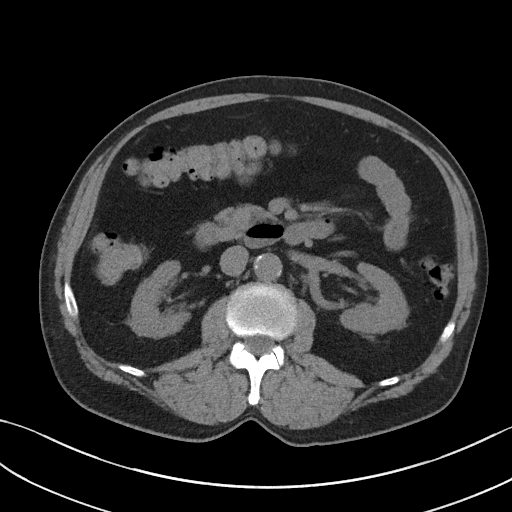
[im 58/87  bone]
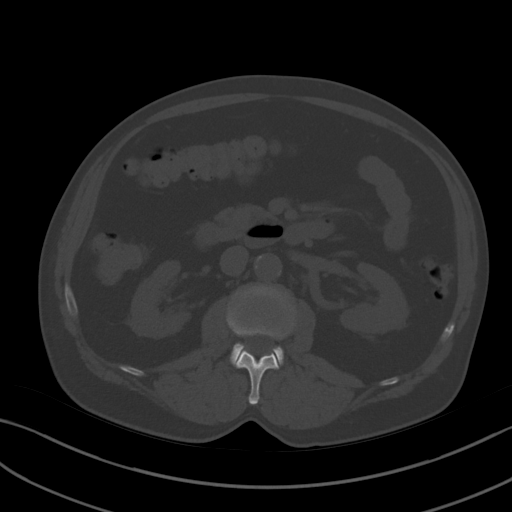
[im 61/87  soft-tissue]
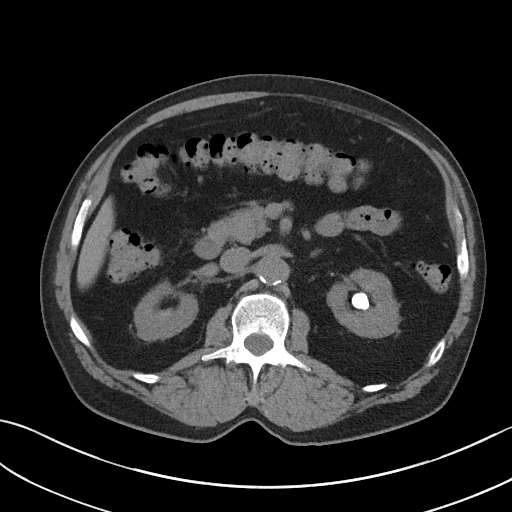
[im 69/87  soft-tissue]
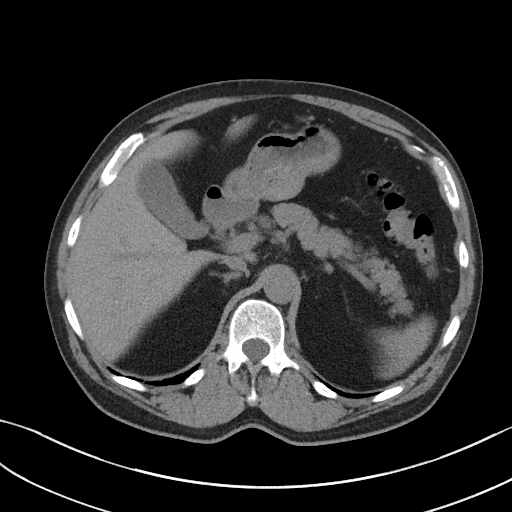
[im 76/87  soft-tissue]
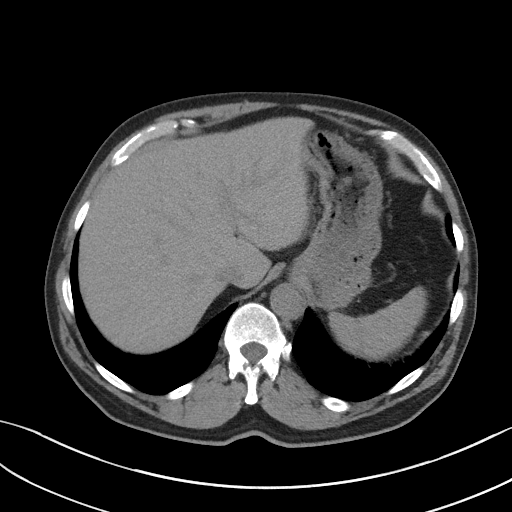
[im 83/87  soft-tissue]
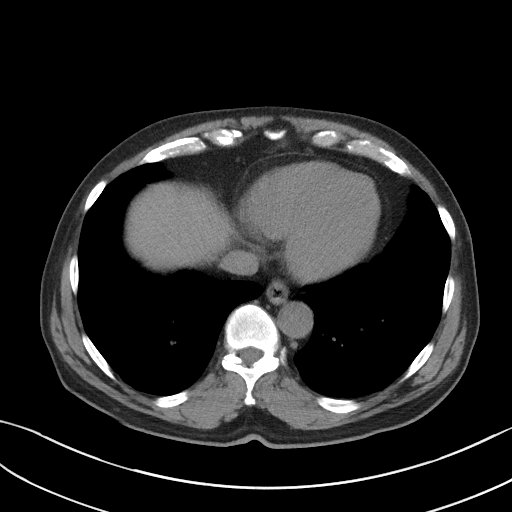

[Series 4: coronal st · coronal · 0.74mm/px · 3 of 100 slices shown]
[im 34/100  soft-tissue]
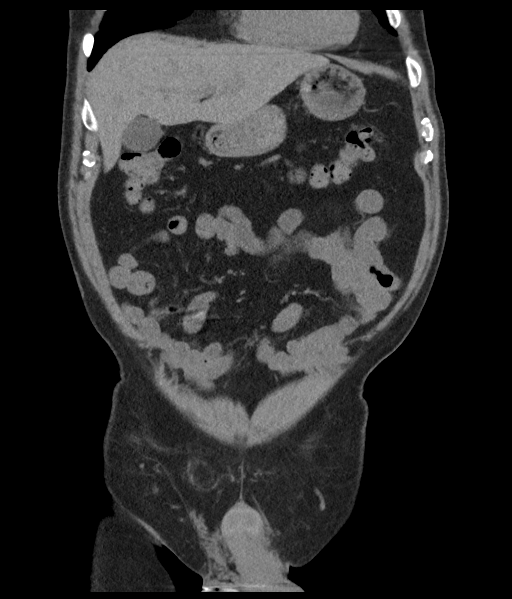
[im 45/100  soft-tissue]
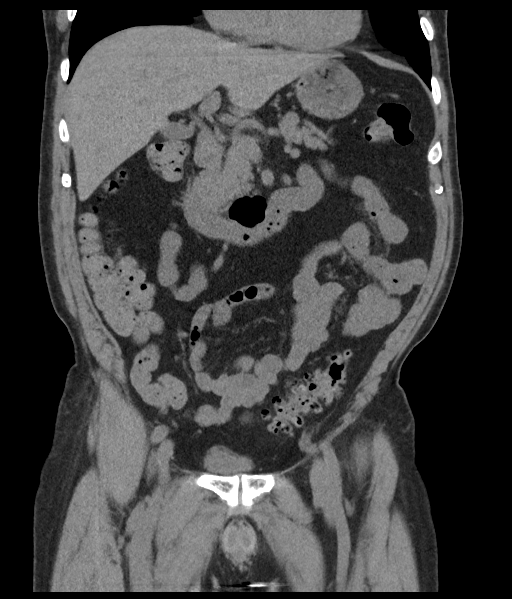
[im 56/100  soft-tissue]
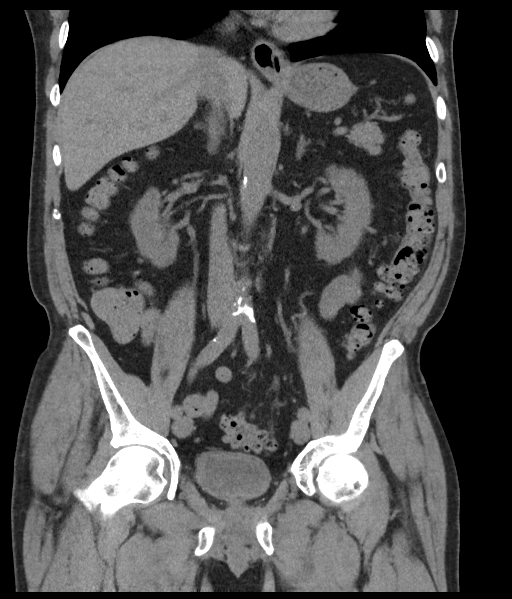

[16 of 46 positions shown; findings below may reference images not displayed]

FINDINGS: Lower chest: Lung bases demonstrate no acute consolidation or
pleural effusion. Normal cardiac size. Small hiatal hernia

Hepatobiliary: No focal liver abnormality is seen. No gallstones,
gallbladder wall thickening, or biliary dilatation.

Pancreas: Unremarkable. No pancreatic ductal dilatation or
surrounding inflammatory changes.

Spleen: Normal in size without focal abnormality.

Adrenals/Urinary Tract: Adrenal glands are normal. Multiple punctate
stones within the bilateral kidneys. Dominant 14 mm stone in the
upper pole left kidney. Probable cyst lower pole left kidney.
Slightly thick-walled urinary bladder with mild soft tissue
stranding.

Stomach/Bowel: Stomach is within normal limits. Appendix appears
normal. No evidence of bowel wall thickening, distention, or
inflammatory changes. Diverticular disease of the left colon without
acute inflammatory change.

Vascular/Lymphatic: Moderate aortic atherosclerosis. No aneurysm. No
suspicious nodes.

Reproductive: Prostate is unremarkable.

Other: Negative for free air or free fluid. Small fat containing
left inguinal hernia

Musculoskeletal: No acute or significant osseous findings.
Degenerative changes at L5-S1.
IMPRESSION: 1. Slightly thick-walled urinary bladder with mild soft tissue
stranding, question cystitis.
2. Multiple bilateral intrarenal stones including 14 mm left kidney
dominant stone. No hydronephrosis or ureteral stone.
3. Left colon diverticular disease without acute inflammatory
change.

Aortic Atherosclerosis ([id number]).

## 2022-03-27 ENCOUNTER — Other Ambulatory Visit: Payer: Self-pay | Admitting: Family Medicine

## 2022-03-27 DIAGNOSIS — M5010 Cervical disc disorder with radiculopathy, unspecified cervical region: Secondary | ICD-10-CM

## 2022-03-27 DIAGNOSIS — I10 Essential (primary) hypertension: Secondary | ICD-10-CM

## 2022-03-29 ENCOUNTER — Encounter: Payer: Self-pay | Admitting: Family Medicine

## 2022-03-29 ENCOUNTER — Telehealth: Payer: Self-pay | Admitting: Family Medicine

## 2022-03-29 ENCOUNTER — Other Ambulatory Visit: Payer: Self-pay

## 2022-03-29 DIAGNOSIS — M5010 Cervical disc disorder with radiculopathy, unspecified cervical region: Secondary | ICD-10-CM

## 2022-03-29 MED ORDER — GABAPENTIN 400 MG PO CAPS: 1200.0000 mg | ORAL_CAPSULE | Freq: Every day | ORAL | 1 refills | Status: DC

## 2022-03-29 NOTE — Telephone Encounter (Signed)
Patient called to advise that he was told that his Gabapentin was denied and he isn't sure why. He said that he will run out Thursday and he cannot sleep without the medication. Please call to advise why denied.

## 2022-03-29 NOTE — Telephone Encounter (Signed)
Unsure why this was denied, but refill was sent to local pharmacy.

## 2022-03-30 ENCOUNTER — Other Ambulatory Visit: Payer: Self-pay | Admitting: Family Medicine

## 2022-03-30 DIAGNOSIS — I10 Essential (primary) hypertension: Secondary | ICD-10-CM

## 2022-05-11 ENCOUNTER — Ambulatory Visit (INDEPENDENT_AMBULATORY_CARE_PROVIDER_SITE_OTHER): Payer: Medicare Other | Admitting: Family

## 2022-05-11 ENCOUNTER — Encounter: Payer: Self-pay | Admitting: Family

## 2022-05-11 VITALS — BP 138/60 | HR 68 | Temp 98.7°F | Ht 67.0 in | Wt 154.4 lb

## 2022-05-11 DIAGNOSIS — M25552 Pain in left hip: Secondary | ICD-10-CM | POA: Diagnosis not present

## 2022-05-11 MED ORDER — MELOXICAM 15 MG PO TABS: 15.0000 mg | ORAL_TABLET | Freq: Every day | ORAL | 0 refills | Status: DC

## 2022-05-11 NOTE — Progress Notes (Signed)
Paul Carlson is a 76 y.o. male with the following history as recorded in EpicCare:  Patient Active Problem List   Diagnosis Date Noted   Bilateral nephrolithiasis 02/24/2021   Generalized abdominal pain 04/29/2020   Hypoxia 10/17/2018   Bilateral arm numbness and tingling while sleeping 07/20/2017   History of hyperlipidemia 02/23/2016   Essential hypertension 02/23/2016   Migraine with aura and without status migrainosus, not intractable 02/19/2015    Current Outpatient Medications  Medication Sig Dispense Refill   acetaminophen (TYLENOL) 650 MG CR tablet Take 650 mg by mouth at bedtime as needed for pain.     aspirin-acetaminophen-caffeine (EXCEDRIN MIGRAINE) 250-250-65 MG tablet Take 1-2 tablets by mouth as needed     gabapentin (NEURONTIN) 400 MG capsule Take 3 capsules (1,200 mg total) by mouth at bedtime. 270 capsule 1   ibuprofen (ADVIL,MOTRIN) 200 MG tablet Take 600 mg by mouth as needed.     lisinopril (ZESTRIL) 5 MG tablet Take 1 tablet (5 mg total) by mouth daily. 30 tablet 0   meloxicam (MOBIC) 15 MG tablet Take 1 tablet (15 mg total) by mouth daily. 30 tablet 0   pantoprazole (PROTONIX) 40 MG tablet TAKE 1 TABLET BY MOUTH EVERY DAY 90 tablet 3   No current facility-administered medications for this visit.    Allergies: Azithromycin, Pollen extract, and Penicillins  Past Medical History:  Diagnosis Date   Borderline hypertension    Cervical radiculopathy    Frequent headaches    GERD (gastroesophageal reflux disease)    Migraines     Past Surgical History:  Procedure Laterality Date   HERNIA REPAIR     LITHOTRIPSY      Family History  Problem Relation Age of Onset   Alzheimer's disease Father    Hypertension Father    Kidney disease Father    Arthritis Father    Cancer Paternal Grandfather        Prostate Cancer   Alcohol abuse Neg Hx     Social History   Tobacco Use   Smoking status: Never   Smokeless tobacco: Never  Substance Use Topics   Alcohol  use: No    Subjective:  Left hip pain x 2 weeks; seemed to start after jumping on and off flat bed trailer; was improving until yesterday re-aggravated while doing yard work; took Ibuprofen and Tylenol last night and has been feeling better;     Objective:  Vitals:   05/11/22 1453 05/11/22 1458  BP: (!) 150/68 138/60  Pulse: 68   Temp: 98.7 F (37.1 C)   TempSrc: Oral   SpO2: 96%   Weight: 154 lb 6.4 oz (70 kg)   Height: '5\' 7"'$  (1.702 m)     General: Well developed, well nourished, in no acute distress  Skin : Warm and dry.  Head: Normocephalic and atraumatic  Lungs: Respirations unlabored;  Musculoskeletal: No deformities; no active joint inflammation  Extremities: No edema, cyanosis, clubbing  Vessels: Symmetric bilaterally  Neurologic: Alert and oriented; speech intact; face symmetrical; moves all extremities well; CNII-XII intact without focal deficit   Assessment:  1. Left hip pain     Plan:  Suspect muscular; responding well to Ibuprofen; will have patient hold this and change to Mobic 15 mg daily for the next 7-10 days; follow up worse, no better.   No follow-ups on file.  No orders of the defined types were placed in this encounter.   Requested Prescriptions   Signed Prescriptions Disp Refills   meloxicam (  MOBIC) 15 MG tablet 30 tablet 0    Sig: Take 1 tablet (15 mg total) by mouth daily.

## 2022-05-12 DIAGNOSIS — U071 COVID-19: Secondary | ICD-10-CM | POA: Diagnosis not present

## 2022-05-12 DIAGNOSIS — Z1152 Encounter for screening for COVID-19: Secondary | ICD-10-CM | POA: Diagnosis not present

## 2022-05-12 DIAGNOSIS — R059 Cough, unspecified: Secondary | ICD-10-CM | POA: Diagnosis not present

## 2022-05-12 DIAGNOSIS — J069 Acute upper respiratory infection, unspecified: Secondary | ICD-10-CM | POA: Diagnosis not present

## 2022-05-20 ENCOUNTER — Other Ambulatory Visit: Payer: Self-pay | Admitting: Family Medicine

## 2022-05-20 DIAGNOSIS — I10 Essential (primary) hypertension: Secondary | ICD-10-CM

## 2022-06-07 ENCOUNTER — Other Ambulatory Visit: Payer: Self-pay | Admitting: Family

## 2022-06-17 ENCOUNTER — Other Ambulatory Visit: Payer: Self-pay | Admitting: Family Medicine

## 2022-06-17 DIAGNOSIS — I10 Essential (primary) hypertension: Secondary | ICD-10-CM

## 2022-07-06 NOTE — Progress Notes (Unsigned)
[place] [facility] [address], [place], [place] [address] [phone]  Date:  07/08/2022   Name:  Paul Carlson   DOB:  April 17, 1946   MRN:  [id number]  PCP:  Darreld Mclean, MD    Chief Complaint: No chief complaint on file.   History of Present Illness:  Paul Carlson is a 76 y.o. very pleasant male patient who presents with the following:  Patient seen today with concern of hand pain Most recent visit with myself was in March of this year History of migraine headache, hypertension, hyperlipidemia, prostatitis and nephrolithiasis   He has urology care for kidney stones Recommend Shingrix, latest COVID booster Flu shot  Patient Active Problem List   Diagnosis Date Noted   Bilateral nephrolithiasis 02/24/2021   Generalized abdominal pain 04/29/2020   Hypoxia 10/17/2018   Bilateral arm numbness and tingling while sleeping 07/20/2017   History of hyperlipidemia 02/23/2016   Essential hypertension 02/23/2016   Migraine with aura and without status migrainosus, not intractable 02/19/2015    Past Medical History:  Diagnosis Date   Borderline hypertension    Cervical radiculopathy    Frequent headaches    GERD (gastroesophageal reflux disease)    Migraines     Past Surgical History:  Procedure Laterality Date   HERNIA REPAIR     LITHOTRIPSY      Social History   Tobacco Use   Smoking status: Never   Smokeless tobacco: Never  Substance Use Topics   Alcohol use: No    Family History  Problem Relation Age of Onset   Alzheimer's disease Father    Hypertension Father    Kidney disease Father    Arthritis Father    Cancer Paternal Grandfather        Prostate Cancer   Alcohol abuse Neg Hx     Allergies  Allergen Reactions   Azithromycin Other (See Comments)    Pt. reported that it gave him severe stomach cramps.   Pollen Extract Other (See Comments)    Pt. reports nasal congestion    Penicillins Rash    Medication list has been reviewed and updated.  Current Outpatient Medications on File Prior to Visit  Medication Sig Dispense Refill   acetaminophen (TYLENOL) 650 MG CR tablet Take 650 mg by mouth at bedtime as needed for pain.     aspirin-acetaminophen-caffeine (EXCEDRIN MIGRAINE) 250-250-65 MG tablet Take 1-2 tablets by mouth as needed     gabapentin (NEURONTIN) 400 MG capsule Take 3 capsules (1,200 mg total) by mouth at bedtime. 270 capsule 1   ibuprofen (ADVIL,MOTRIN) 200 MG tablet Take 600 mg by mouth as needed.     lisinopril (ZESTRIL) 5 MG tablet Take 1 tablet (5 mg total) by mouth daily. 30 tablet 0   meloxicam (MOBIC) 15 MG tablet Take 1 tablet (15 mg total) by mouth daily. Use as needed for pain 30 tablet 0   pantoprazole (PROTONIX) 40 MG tablet TAKE 1 TABLET BY MOUTH EVERY DAY 90 tablet 3   No current facility-administered medications on file prior to visit.    Review of Systems:  As per HPI- otherwise negative.  Physical Examination: There were no vitals filed for this visit. There were no vitals filed for this visit. There is no height or weight on file to calculate BMI. Ideal Body Weight:    GEN: no acute distress. HEENT: Atraumatic, Normocephalic.  Ears and Nose: No external deformity. CV:  RRR, No M/G/R. No JVD. No thrill. No extra heart sounds. PULM: CTA B, no wheezes, crackles, rhonchi. No retractions. No resp. distress. No accessory muscle use. ABD: S, NT, ND, +BS. No rebound. No HSM. EXTR: No c/c/e PSYCH: Normally interactive. Conversant.    Assessment and Plan: ***  Signed Lamar Blinks, MD

## 2022-07-06 NOTE — Patient Instructions (Incomplete)
It was good to see again today Recommend COVID booster, shingles vaccine, RSV, flu shot if not done already

## 2022-07-08 ENCOUNTER — Encounter: Payer: Self-pay | Admitting: Family Medicine

## 2022-07-08 ENCOUNTER — Ambulatory Visit (INDEPENDENT_AMBULATORY_CARE_PROVIDER_SITE_OTHER): Payer: Medicare Other | Admitting: Family Medicine

## 2022-07-08 ENCOUNTER — Ambulatory Visit (HOSPITAL_BASED_OUTPATIENT_CLINIC_OR_DEPARTMENT_OTHER)
Admission: RE | Admit: 2022-07-08 | Discharge: 2022-07-08 | Disposition: A | Discharge status: Home / Self Care | Payer: Medicare Other | Source: Ambulatory Visit | Attending: Family Medicine | Admitting: Family Medicine

## 2022-07-08 VITALS — BP 118/60 | HR 66 | Temp 98.0°F | Resp 18 | Ht 67.0 in | Wt 155.4 lb

## 2022-07-08 DIAGNOSIS — M79642 Pain in left hand: Secondary | ICD-10-CM | POA: Insufficient documentation

## 2022-07-08 DIAGNOSIS — M79641 Pain in right hand: Secondary | ICD-10-CM

## 2022-07-08 DIAGNOSIS — Z8639 Personal history of other endocrine, nutritional and metabolic disease: Secondary | ICD-10-CM | POA: Diagnosis not present

## 2022-07-08 DIAGNOSIS — M25552 Pain in left hip: Secondary | ICD-10-CM | POA: Insufficient documentation

## 2022-07-08 DIAGNOSIS — Z131 Encounter for screening for diabetes mellitus: Secondary | ICD-10-CM | POA: Diagnosis not present

## 2022-07-08 DIAGNOSIS — Z23 Encounter for immunization: Secondary | ICD-10-CM | POA: Diagnosis not present

## 2022-07-08 DIAGNOSIS — R944 Abnormal results of kidney function studies: Secondary | ICD-10-CM

## 2022-07-08 DIAGNOSIS — M25511 Pain in right shoulder: Secondary | ICD-10-CM

## 2022-07-08 LAB — LIPID PANEL
Cholesterol: 204 mg/dL — ABNORMAL HIGH (ref 0–200)
HDL: 49.5 mg/dL (ref >39.00)
LDL Cholesterol: 135 mg/dL — ABNORMAL HIGH (ref 0–99)
NonHDL: 154.92
Total CHOL/HDL Ratio: 4
Triglycerides: 99 mg/dL (ref 0.0–149.0)
VLDL: 19.8 mg/dL (ref 0.0–40.0)

## 2022-07-08 LAB — CBC
HCT: 43.3 % (ref 39.0–52.0)
Hemoglobin: 14.6 g/dL (ref 13.0–17.0)
MCHC: 33.7 g/dL (ref 30.0–36.0)
MCV: 91.9 fl (ref 78.0–100.0)
Platelets: 212 10*3/uL (ref 150.0–400.0)
RBC: 4.71 Mil/uL (ref 4.22–5.81)
RDW: 14.4 % (ref 11.5–15.5)
WBC: 7.8 10*3/uL (ref 4.0–10.5)

## 2022-07-08 LAB — BASIC METABOLIC PANEL
BUN: 23 mg/dL (ref 6–23)
CO2: 28 mEq/L (ref 19–32)
Calcium: 9.5 mg/dL (ref 8.4–10.5)
Chloride: 104 mEq/L (ref 96–112)
Creatinine, Ser: 1.45 mg/dL (ref 0.40–1.50)
GFR: 47.5 mL/min — ABNORMAL LOW (ref >60.00)
Glucose, Bld: 99 mg/dL (ref 70–99)
Potassium: 4.9 mEq/L (ref 3.5–5.1)
Sodium: 139 mEq/L (ref 135–145)

## 2022-07-08 LAB — HEMOGLOBIN A1C: Hgb A1c MFr Bld: 6 % (ref 4.6–6.5)

## 2022-07-09 DIAGNOSIS — N200 Calculus of kidney: Secondary | ICD-10-CM | POA: Diagnosis not present

## 2022-07-10 NOTE — Addendum Note (Signed)
Addended by: Lamar Blinks C on: 07/10/2022 05:18 PM   Modules accepted: Orders

## 2022-07-20 ENCOUNTER — Other Ambulatory Visit: Payer: Self-pay | Admitting: Family Medicine

## 2022-07-20 DIAGNOSIS — I10 Essential (primary) hypertension: Secondary | ICD-10-CM

## 2022-07-23 ENCOUNTER — Telehealth: Payer: Self-pay | Admitting: Family Medicine

## 2022-07-23 NOTE — Telephone Encounter (Signed)
Patient given number to radiology to have them get a copy for him.

## 2022-07-23 NOTE — Telephone Encounter (Signed)
Patient states he has an appt with [facility] next week on 01/17 and they are requesting his hand imaging results for his appt. He would like to have a copy of the hand imaging for his left & right hand and also his hip imaging. He would liek a call back to know when he can pick it up. Please advise.

## 2022-07-26 ENCOUNTER — Ambulatory Visit (INDEPENDENT_AMBULATORY_CARE_PROVIDER_SITE_OTHER): Payer: Medicare Other | Admitting: *Deleted

## 2022-07-26 DIAGNOSIS — Z0000 Encounter for general adult medical examination without abnormal findings: Secondary | ICD-10-CM

## 2022-07-26 NOTE — Progress Notes (Signed)
Subjective:   Paul Carlson is a 77 y.o. male who presents for Medicare Annual/Subsequent preventive examination.  I connected with  Lyn Hollingshead on 07/26/22 by a audio enabled telemedicine application and verified that I am speaking with the correct person using two identifiers.  Patient Location: Home  Provider Location: Office/Clinic  I discussed the limitations of evaluation and management by telemedicine. The patient expressed understanding and agreed to proceed.   Review of Systems    Defer to PCP Cardiac Risk Factors include: advanced age (>82mn, >>9women);male gender;hypertension     Objective:    Today's Vitals   07/26/22 0900  PainSc: 2    There is no height or weight on file to calculate BMI.     07/26/2022    9:04 AM 07/23/2021    3:10 PM 03/31/2021    3:42 PM 01/16/2021    2:06 PM 07/10/2020   10:38 AM 06/29/2019   11:09 AM 05/25/2018   11:30 AM  Advanced Directives  Does Patient Have a Medical Advance Directive? No No No No No No No  Would patient like information on creating a medical advance directive? No - Patient declined Yes (MAU/Ambulatory/Procedural Areas - Information given)  No - Patient declined Yes (MAU/Ambulatory/Procedural Areas - Information given) No - Patient declined Yes (MAU/Ambulatory/Procedural Areas - Information given)    Current Medications (verified) Outpatient Encounter Medications as of 07/26/2022  Medication Sig   acetaminophen (TYLENOL) 650 MG CR tablet Take 650 mg by mouth at bedtime as needed for pain.   aspirin-acetaminophen-caffeine (EXCEDRIN MIGRAINE) 250-250-65 MG tablet Take 1-2 tablets by mouth as needed   gabapentin (NEURONTIN) 400 MG capsule Take 3 capsules (1,200 mg total) by mouth at bedtime.   ibuprofen (ADVIL,MOTRIN) 200 MG tablet Take 600 mg by mouth as needed.   lisinopril (ZESTRIL) 5 MG tablet Take 1 tablet (5 mg total) by mouth daily.   pantoprazole (PROTONIX) 40 MG tablet TAKE 1 TABLET BY MOUTH EVERY DAY    [DISCONTINUED] meloxicam (MOBIC) 15 MG tablet Take 1 tablet (15 mg total) by mouth daily. Use as needed for pain   No facility-administered encounter medications on file as of 07/26/2022.    Allergies (verified) Azithromycin, Pollen extract, and Penicillins   History: Past Medical History:  Diagnosis Date   Borderline hypertension    Cervical radiculopathy    Frequent headaches    GERD (gastroesophageal reflux disease)    Migraines    Past Surgical History:  Procedure Laterality Date   HERNIA REPAIR     LITHOTRIPSY     Family History  Problem Relation Age of Onset   Alzheimer's disease Father    Hypertension Father    Kidney disease Father    Arthritis Father    Cancer Paternal Grandfather        Prostate Cancer   Alcohol abuse Neg Hx    Social History   Socioeconomic History   Marital status: Married    Spouse name: Not on file   Number of children: Not on file   Years of education: Not on file   Highest education level: Not on file  Occupational History   Occupation: PTheme park manager Tobacco Use   Smoking status: Never   Smokeless tobacco: Never  Substance and Sexual Activity   Alcohol use: No   Drug use: Not on file   Sexual activity: Not on file  Other Topics Concern   Not on file  Social History Narrative   Not on file  Social Determinants of Health   Financial Resource Strain: Low Risk  (07/23/2021)   Overall Financial Resource Strain (CARDIA)    Difficulty of Paying Living Expenses: Not hard at all  Food Insecurity: No Food Insecurity (07/26/2022)   Hunger Vital Sign    Worried About Running Out of Food in the Last Year: Never true    Ran Out of Food in the Last Year: Never true  Transportation Needs: No Transportation Needs (07/26/2022)   PRAPARE - Hydrologist (Medical): No    Lack of Transportation (Non-Medical): No  Physical Activity: Inactive (07/23/2021)   Exercise Vital Sign    Days of Exercise per Week: 0 days     Minutes of Exercise per Session: 0 min  Stress: No Stress Concern Present (07/23/2021)   [place]    Feeling of Stress : Not at all  Social Connections: Moderately Integrated (07/23/2021)   Social Connection and Isolation Panel [NHANES]    Frequency of Communication with Friends and Family: More than three times a week    Frequency of Social Gatherings with Friends and Family: More than three times a week    Attends Religious Services: More than 4 times per year    Active Member of [employer] or Organizations: No    Attends Music therapist: Never    Marital Status: Married    Tobacco Counseling Counseling given: Not Answered   Clinical Intake:  Pre-visit preparation completed: Yes  Pain : 0-10 Pain Score: 2  Pain Location: Hand Pain Descriptors / Indicators: Aching Pain Onset: More than a month ago     Diabetes: No  How often do you need to have someone help you when you read instructions, pamphlets, or other written materials from your doctor or pharmacy?: 1 - Never  Activities of Daily Living    07/26/2022    9:07 AM  In your present state of health, do you have any difficulty performing the following activities:  Hearing? 1  Comment hearing loss  Vision? 0  Difficulty concentrating or making decisions? 0  Walking or climbing stairs? 0  Dressing or bathing? 0  Doing errands, shopping? 0  Preparing Food and eating ? N  Using the Toilet? N  In the past six months, have you accidently leaked urine? Y  Do you have problems with loss of bowel control? N  Managing your Medications? N  Managing your Finances? N  Housekeeping or managing your Housekeeping? N    Patient Care Team: Copland, Gay Filler, MD as PCP - General (Family Medicine)  Indicate any recent Medical Services you may have received from other than [facility] providers in the past year (date may be approximate).     Assessment:    This is a routine wellness examination for Paul Carlson.  Hearing/Vision screen No results found.  Dietary issues and exercise activities discussed: Current Exercise Habits: The patient does not participate in regular exercise at present, Exercise limited by: None identified   Goals Addressed   None    Depression Screen    07/26/2022    9:06 AM 05/11/2022    2:54 PM 09/21/2021   10:02 AM 07/23/2021    3:13 PM 01/21/2021    1:41 PM 07/10/2020   10:42 AM 06/29/2019   11:13 AM  PHQ 2/9 Scores  PHQ - 2 Score 0 0 0 0 0 0 0    Fall Risk    07/26/2022  9:05 AM 05/11/2022    2:54 PM 09/21/2021   10:02 AM 07/23/2021    3:12 PM 01/21/2021    1:41 PM  [place] in the past year? 0 0 0 0 0  Number falls in past yr: 0 0 0 0 0  Injury with Fall? 0 0 0 0 0  Risk for fall due to : No Fall Risks No Fall Risks   No Fall Risks  Follow up Falls evaluation completed Falls evaluation completed  Falls prevention discussed Falls evaluation completed    [place]:  Any stairs in or around the home? Yes  If so, are there any without handrails? No  Home free of loose throw rugs in walkways, pet beds, electrical cords, etc? Yes  Adequate lighting in your home to reduce risk of falls? Yes   ASSISTIVE DEVICES UTILIZED TO PREVENT FALLS:  Life alert? No  Use of a cane, walker or w/c? No  Grab bars in the bathroom? No  Shower chair or bench in shower? No  Elevated toilet seat or a handicapped toilet? No   TIMED UP AND GO:  Was the test performed?  No, audio visit .   Cognitive Function:        07/26/2022    9:11 AM  6CIT Screen  What Year? 0 points  What month? 0 points  What time? 0 points  Count back from 20 0 points  Months in reverse 0 points  Repeat phrase 6 points  Total Score 6 points    Immunizations Immunization History  Administered Date(s) Administered   Fluad Quad(high Dose 65+) 07/08/2022   PFIZER(Purple Top)SARS-COV-2  Vaccination 10/01/2019, 11/08/2019, 06/23/2020   PNEUMOCOCCAL CONJUGATE-20 09/21/2021   Tdap 02/19/2015    TDAP status: Up to date  Flu Vaccine status: Up to date  Pneumococcal vaccine status: Up to date  Covid-19 vaccine status: Information provided on how to obtain vaccines.   Qualifies for Shingles Vaccine? Yes   Zostavax completed No   Shingrix Completed?: No.    Education has been provided regarding the importance of this vaccine. Patient has been advised to call insurance company to determine out of pocket expense if they have not yet received this vaccine. Advised may also receive vaccine at local pharmacy or Health Dept. Verbalized acceptance and understanding.  Screening Tests Health Maintenance  Topic Date Due   Zoster Vaccines- Shingrix (1 of 2) Never done   COLONOSCOPY (Pts 45-41yr Insurance coverage will need to be confirmed)  11/08/2020   COVID-19 Vaccine (4 - 2023-24 season) 03/12/2022   Medicare Annual Wellness (AWV)  07/23/2022   DTaP/Tdap/Td (2 - Td or Tdap) 02/18/2025   Pneumonia Vaccine 75 Years old  Completed   INFLUENZA VACCINE  Completed   Hepatitis C Screening  Completed   HPV VACCINES  Aged Out    Health Maintenance  Health Maintenance Due  Topic Date Due   Zoster Vaccines- Shingrix (1 of 2) Never done   COLONOSCOPY (Pts 45-43yrInsurance coverage will need to be confirmed)  11/08/2020   COVID-19 Vaccine (4 - 2023-24 season) 03/12/2022   Medicare Annual Wellness (AWV)  07/23/2022    Colorectal cancer screening: Type of screening: Colonoscopy. Completed 11/08/17. Repeat every 3 years  Lung Cancer Screening: (Low Dose CT Chest recommended if Age 75-80ears, 30 pack-year currently smoking OR have quit w/in 15years.) does not qualify.   Additional Screening:  Hepatitis C Screening: does qualify; Completed 08/29/17  Vision  Screening: Recommended annual ophthalmology exams for early detection of glaucoma and other disorders of the eye. Is the  patient up to date with their annual eye exam?  Yes  Who is the provider or what is the name of the office in which the patient attends annual eye exams? Doesn't remember name If pt is not established with a provider, would they like to be referred to a provider to establish care? No .   Dental Screening: Recommended annual dental exams for proper oral hygiene  Community Resource Referral / Chronic Care Management: CRR required this visit?  No   CCM required this visit?  No      Plan:     I have personally reviewed and noted the following in the patient's chart:   Medical and social history Use of alcohol, tobacco or illicit drugs  Current medications and supplements including opioid prescriptions. Patient is not currently taking opioid prescriptions. Functional ability and status Nutritional status Physical activity Advanced directives List of other physicians Hospitalizations, surgeries, and ER visits in previous 12 months Vitals Screenings to include cognitive, depression, and falls Referrals and appointments  In addition, I have reviewed and discussed with patient certain preventive protocols, quality metrics, and best practice recommendations. A written personalized care plan for preventive services as well as general preventive health recommendations were provided to patient.   Due to this being a telephonic visit, the after visit summary with patients personalized plan was offered to patient via mail or my-chart. Patient would like to access on my-chart.  Beatris Ship, [place]   07/26/2022   Nurse Notes: None

## 2022-07-26 NOTE — Patient Instructions (Signed)
Mr. Paul Carlson , Thank you for taking time to come for your Medicare Wellness Visit. I appreciate your ongoing commitment to your health goals. Please review the following plan we discussed and let me know if I can assist you in the future.   These are the goals we discussed:  Goals      DIET - INCREASE WATER INTAKE        This is a list of the screening recommended for you and due dates:  Health Maintenance  Topic Date Due   Zoster (Shingles) Vaccine (1 of 2) Never done   Colon Cancer Screening  11/08/2020   COVID-19 Vaccine (4 - 2023-24 season) 03/12/2022   Medicare Annual Wellness Visit  07/27/2023   DTaP/Tdap/Td vaccine (2 - Td or Tdap) 02/18/2025   Pneumonia Vaccine  Completed   Flu Shot  Completed   Hepatitis C Screening: USPSTF Recommendation to screen - Ages 18-79 yo.  Completed   HPV Vaccine  Aged Out     Next appointment: Follow up in one year for your annual wellness visit.   Preventive Care 75 Years and Older, Male Preventive care refers to lifestyle choices and visits with your health care provider that can promote health and wellness. What does preventive care include? A yearly physical exam. This is also called an annual well check. Dental exams once or twice a year. Routine eye exams. Ask your health care provider how often you should have your eyes checked. Personal lifestyle choices, including: Daily care of your teeth and gums. Regular physical activity. Eating a healthy diet. Avoiding tobacco and drug use. Limiting alcohol use. Practicing safe sex. Taking low doses of aspirin every day. Taking vitamin and mineral supplements as recommended by your health care provider. What happens during an annual well check? The services and screenings done by your health care provider during your annual well check will depend on your age, overall health, lifestyle risk factors, and family history of disease. Counseling  Your health care provider may ask you questions  about your: Alcohol use. Tobacco use. Drug use. Emotional well-being. Home and relationship well-being. Sexual activity. Eating habits. History of falls. Memory and ability to understand (cognition). Work and work Statistician. Screening  You may have the following tests or measurements: Height, weight, and BMI. Blood pressure. Lipid and cholesterol levels. These may be checked every 5 years, or more frequently if you are over 68 years old. Skin check. Lung cancer screening. You may have this screening every year starting at age 60 if you have a 30-pack-year history of smoking and currently smoke or have quit within the past 15 years. Fecal occult blood test (FOBT) of the stool. You may have this test every year starting at age 80. Flexible sigmoidoscopy or colonoscopy. You may have a sigmoidoscopy every 5 years or a colonoscopy every 10 years starting at age 42. Prostate cancer screening. Recommendations will vary depending on your family history and other risks. Hepatitis C blood test. Hepatitis B blood test. Sexually transmitted disease (STD) testing. Diabetes screening. This is done by checking your blood sugar (glucose) after you have not eaten for a while (fasting). You may have this done every 1-3 years. Abdominal aortic aneurysm (AAA) screening. You may need this if you are a current or former smoker. Osteoporosis. You may be screened starting at age 45 if you are at high risk. Talk with your health care provider about your test results, treatment options, and if necessary, the need for more tests. Vaccines  Your health care provider may recommend certain vaccines, such as: Influenza vaccine. This is recommended every year. Tetanus, diphtheria, and acellular pertussis (Tdap, Td) vaccine. You may need a Td booster every 10 years. Zoster vaccine. You may need this after age 39. Pneumococcal 13-valent conjugate (PCV13) vaccine. One dose is recommended after age 25. Pneumococcal  polysaccharide (PPSV23) vaccine. One dose is recommended after age 42. Talk to your health care provider about which screenings and vaccines you need and how often you need them. This information is not intended to replace advice given to you by your health care provider. Make sure you discuss any questions you have with your health care provider. Document Released: 07/25/2015 Document Revised: 03/17/2016 Document Reviewed: 04/29/2015 Elsevier Interactive Patient Education  2017 [place] Prevention in the Home Falls can cause injuries. They can happen to people of all ages. There are many things you can do to make your home safe and to help prevent falls. What can I do on the outside of my home? Regularly fix the edges of walkways and driveways and fix any cracks. Remove anything that might make you trip as you walk through a door, such as a raised step or threshold. Trim any bushes or trees on the path to your home. Use bright outdoor lighting. Clear any walking paths of anything that might make someone trip, such as rocks or tools. Regularly check to see if handrails are loose or broken. Make sure that both sides of any steps have handrails. Any raised decks and porches should have guardrails on the edges. Have any leaves, snow, or ice cleared regularly. Use sand or salt on walking paths during winter. Clean up any spills in your garage right away. This includes oil or grease spills. What can I do in the bathroom? Use night lights. Install grab bars by the toilet and in the tub and shower. Do not use towel bars as grab bars. Use non-skid mats or decals in the tub or shower. If you need to sit down in the shower, use a plastic, non-slip stool. Keep the floor dry. Clean up any water that spills on the floor as soon as it happens. Remove soap buildup in the tub or shower regularly. Attach bath mats securely with double-sided non-slip rug tape. Do not have throw rugs and other  things on the floor that can make you trip. What can I do in the bedroom? Use night lights. Make sure that you have a light by your bed that is easy to reach. Do not use any sheets or blankets that are too big for your bed. They should not hang down onto the floor. Have a firm chair that has side arms. You can use this for support while you get dressed. Do not have throw rugs and other things on the floor that can make you trip. What can I do in the kitchen? Clean up any spills right away. Avoid walking on wet floors. Keep items that you use a lot in easy-to-reach places. If you need to reach something above you, use a strong step stool that has a grab bar. Keep electrical cords out of the way. Do not use floor polish or wax that makes floors slippery. If you must use wax, use non-skid floor wax. Do not have throw rugs and other things on the floor that can make you trip. What can I do with my stairs? Do not leave any items on the stairs. Make sure that there are  handrails on both sides of the stairs and use them. Fix handrails that are broken or loose. Make sure that handrails are as long as the stairways. Check any carpeting to make sure that it is firmly attached to the stairs. Fix any carpet that is loose or worn. Avoid having throw rugs at the top or bottom of the stairs. If you do have throw rugs, attach them to the floor with carpet tape. Make sure that you have a light switch at the top of the stairs and the bottom of the stairs. If you do not have them, ask someone to add them for you. What else can I do to help prevent falls? Wear shoes that: Do not have high heels. Have rubber bottoms. Are comfortable and fit you well. Are closed at the toe. Do not wear sandals. If you use a stepladder: Make sure that it is fully opened. Do not climb a closed stepladder. Make sure that both sides of the stepladder are locked into place. Ask someone to hold it for you, if possible. Clearly  mark and make sure that you can see: Any grab bars or handrails. First and last steps. Where the edge of each step is. Use tools that help you move around (mobility aids) if they are needed. These include: Canes. Walkers. Scooters. Crutches. Turn on the lights when you go into a dark area. Replace any light bulbs as soon as they burn out. Set up your furniture so you have a clear path. Avoid moving your furniture around. If any of your floors are uneven, fix them. If there are any pets around you, be aware of where they are. Review your medicines with your doctor. Some medicines can make you feel dizzy. This can increase your chance of falling. Ask your doctor what other things that you can do to help prevent falls. This information is not intended to replace advice given to you by your health care provider. Make sure you discuss any questions you have with your health care provider. Document Released: 04/24/2009 Document Revised: 12/04/2015 Document Reviewed: 08/02/2014 Elsevier Interactive Patient Education  2017 [employer].

## 2022-07-28 DIAGNOSIS — M1811 Unilateral primary osteoarthritis of first carpometacarpal joint, right hand: Secondary | ICD-10-CM | POA: Diagnosis not present

## 2022-07-28 DIAGNOSIS — M180 Bilateral primary osteoarthritis of first carpometacarpal joints: Secondary | ICD-10-CM | POA: Diagnosis not present

## 2022-08-09 ENCOUNTER — Encounter: Payer: Self-pay | Admitting: Family Medicine

## 2022-08-09 ENCOUNTER — Other Ambulatory Visit (INDEPENDENT_AMBULATORY_CARE_PROVIDER_SITE_OTHER): Payer: Medicare Other

## 2022-08-09 DIAGNOSIS — R944 Abnormal results of kidney function studies: Secondary | ICD-10-CM

## 2022-08-09 DIAGNOSIS — E875 Hyperkalemia: Secondary | ICD-10-CM

## 2022-08-09 LAB — BASIC METABOLIC PANEL
BUN: 15 mg/dL (ref 6–23)
CO2: 28 mEq/L (ref 19–32)
Calcium: 9.6 mg/dL (ref 8.4–10.5)
Chloride: 101 mEq/L (ref 96–112)
Creatinine, Ser: 1.23 mg/dL (ref 0.40–1.50)
GFR: 57.84 mL/min — ABNORMAL LOW (ref >60.00)
Glucose, Bld: 88 mg/dL (ref 70–99)
Potassium: 4.6 mEq/L (ref 3.5–5.1)
Sodium: 138 mEq/L (ref 135–145)

## 2022-08-11 DIAGNOSIS — N281 Cyst of kidney, acquired: Secondary | ICD-10-CM | POA: Diagnosis not present

## 2022-08-11 DIAGNOSIS — N200 Calculus of kidney: Secondary | ICD-10-CM | POA: Diagnosis not present

## 2022-09-09 ENCOUNTER — Encounter: Payer: Self-pay | Admitting: Family Medicine

## 2022-09-21 ENCOUNTER — Encounter: Payer: Self-pay | Admitting: Family Medicine

## 2022-09-23 ENCOUNTER — Other Ambulatory Visit: Payer: Self-pay | Admitting: Family Medicine

## 2022-09-23 DIAGNOSIS — M5010 Cervical disc disorder with radiculopathy, unspecified cervical region: Secondary | ICD-10-CM

## 2022-10-25 IMAGING — CR DG CHEST 1V
1 series · 1 of 1 positions shown · non-contrast
Comparison: 10/16/2018 from [REDACTED]

CLINICAL DATA: Weakness.  Chronic cough.  Ex-smoker

EXAM:
CHEST  1 VIEW

[w chest pa]
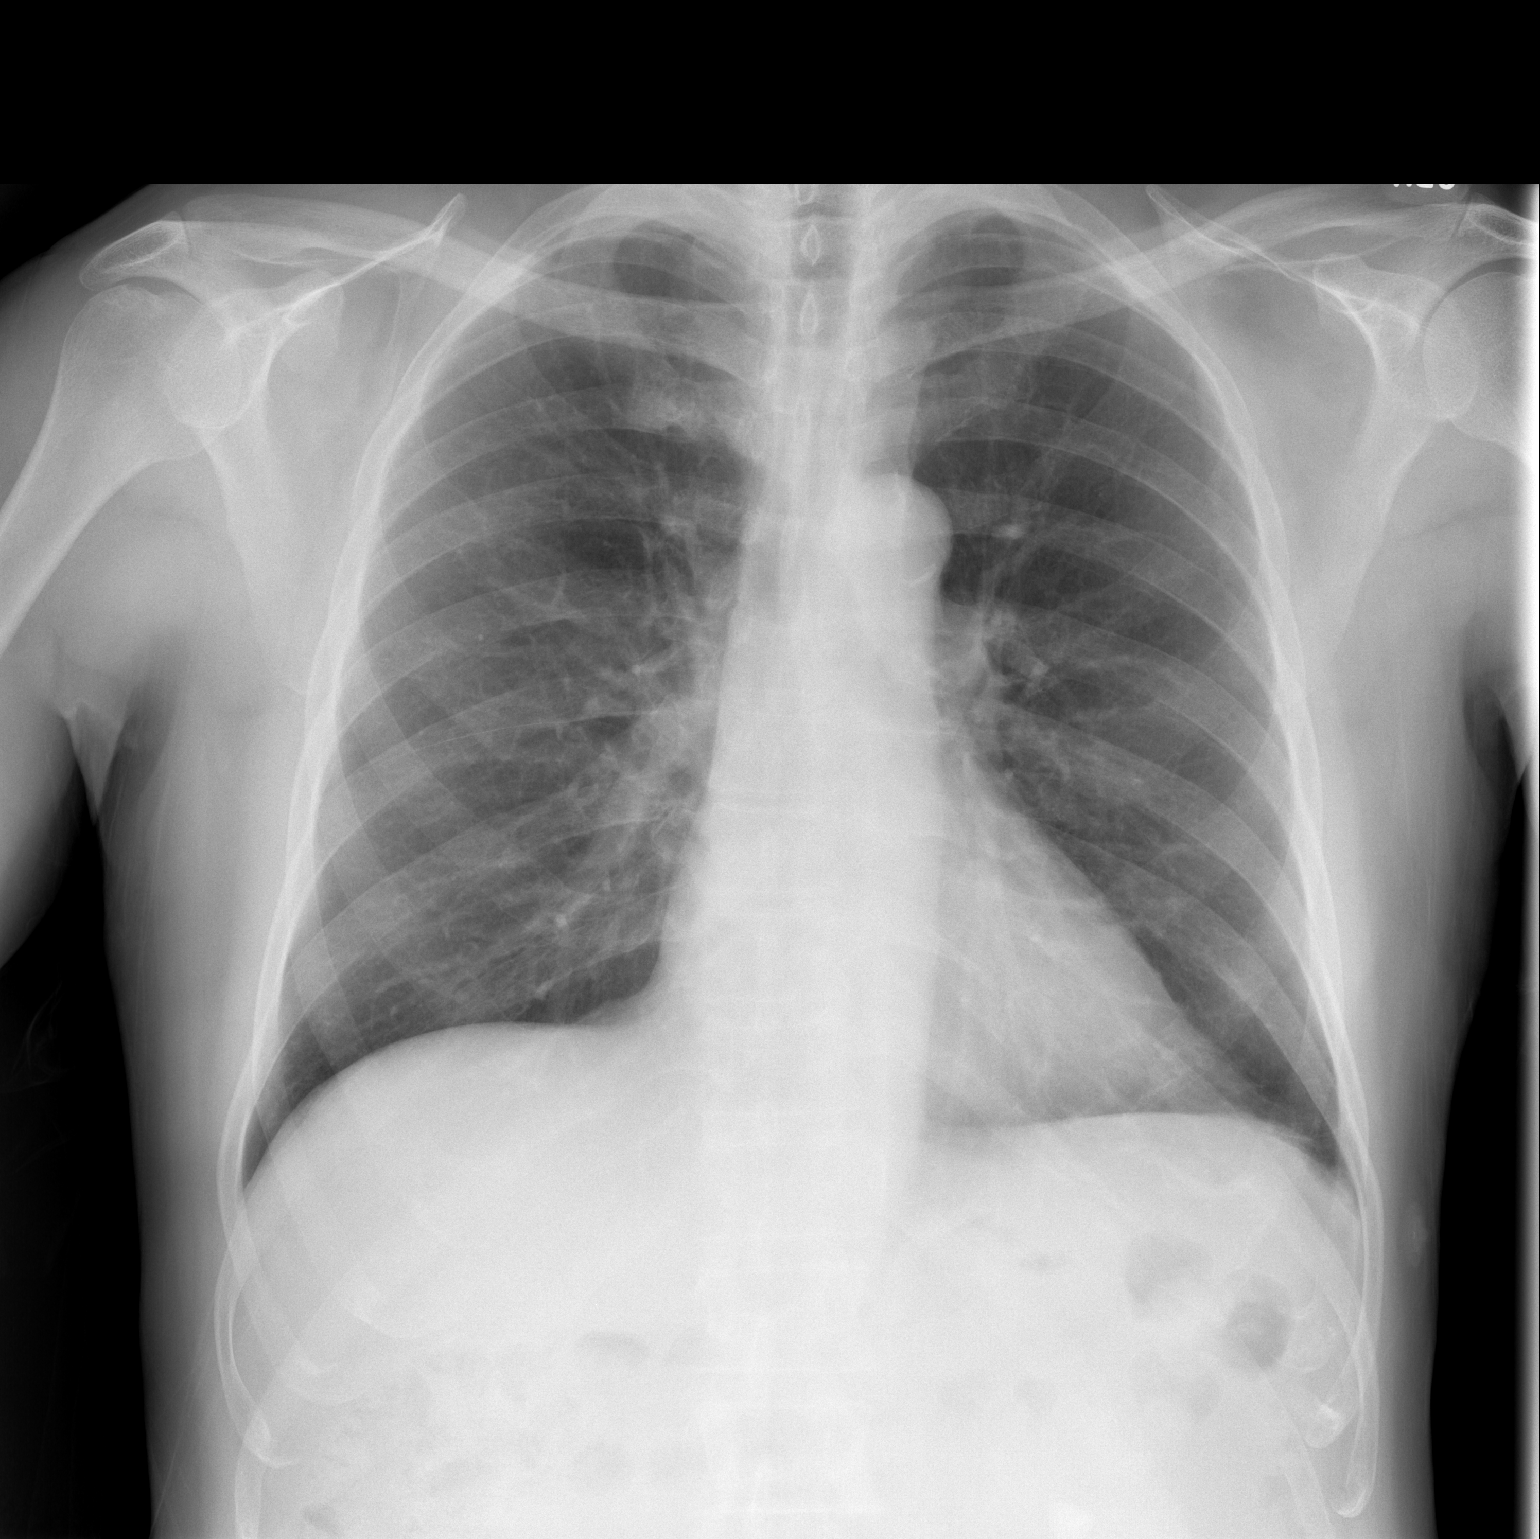

[1 of 1 positions shown; findings below may reference images not displayed]

FINDINGS: Midline trachea. Normal heart size. Atherosclerosis in the
transverse aorta. No pleural effusion or pneumothorax. Mild
hyperinflation. Mild left base scarring laterally. The anterior
first right rib is prominent, similar.
IMPRESSION: Mild hyperinflation, without acute disease.

Aortic Atherosclerosis (00K0O-81L.L).

## 2022-12-28 ENCOUNTER — Other Ambulatory Visit: Payer: Self-pay | Admitting: Family Medicine

## 2022-12-28 DIAGNOSIS — R1084 Generalized abdominal pain: Secondary | ICD-10-CM

## 2023-01-07 IMAGING — CT CT ABD-PELV W/ CM
2 of 5 series · 15 of 46 positions shown, 17 images · IV contrast (APPLIED)
Comparison: 05/05/2020

CLINICAL DATA: Two day history of abdominal pain and diarrhea.

EXAM:
CT ABDOMEN AND PELVIS WITH CONTRAST
TECHNIQUE: Multidetector CT imaging of the abdomen and pelvis was performed
using the standard protocol following bolus administration of
intravenous contrast.
CONTRAST:  75mL OMNIPAQUE IOHEXOL 350 MG/ML SOLN

[Series 2: abd pel w · axial · 0.66mm/px · z∈[+707,+1112]mm · 12 of 91 slices shown, 14 images]
[im 5/91  soft-tissue]
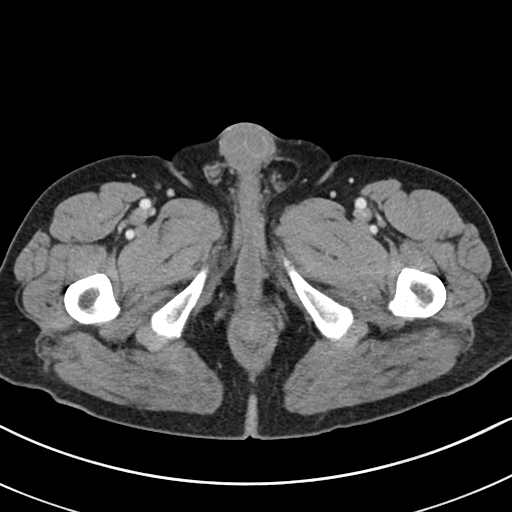
[im 5/91  bone]
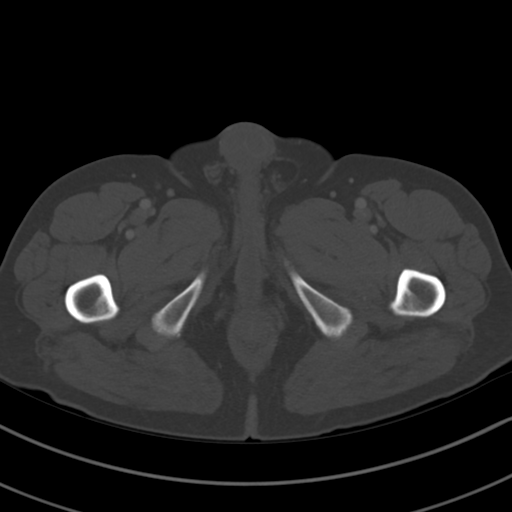
[im 15/91  soft-tissue]
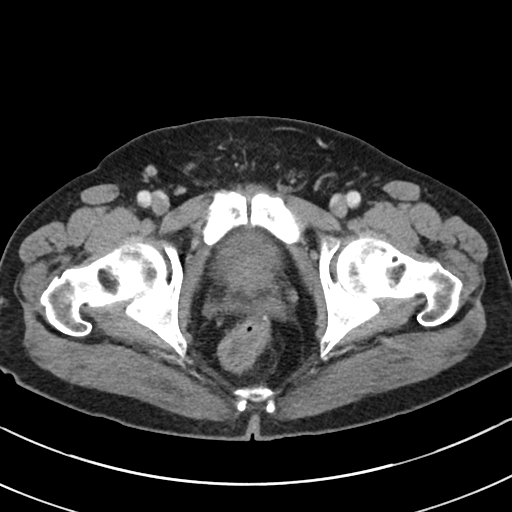
[im 19/91  soft-tissue]
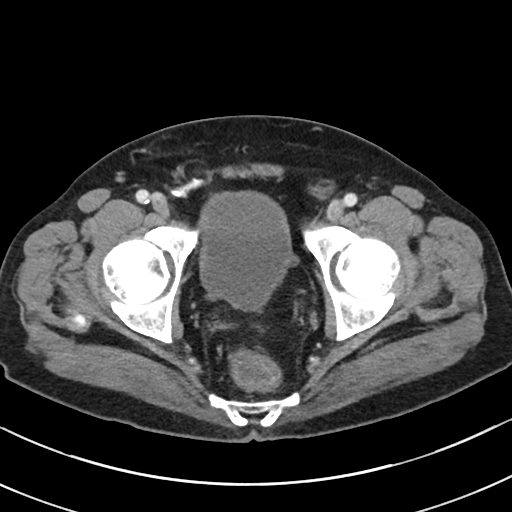
[im 29/91  soft-tissue]
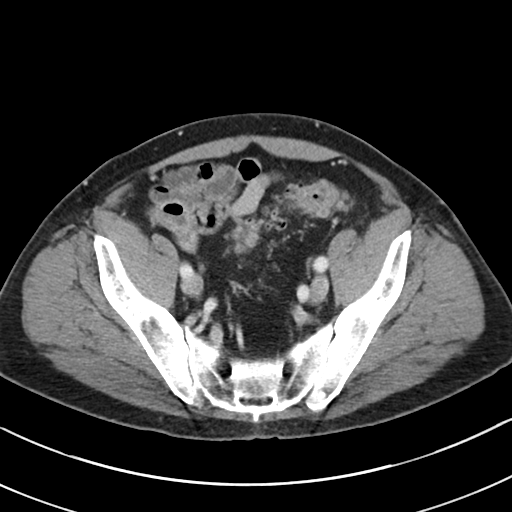
[im 34/91  soft-tissue]
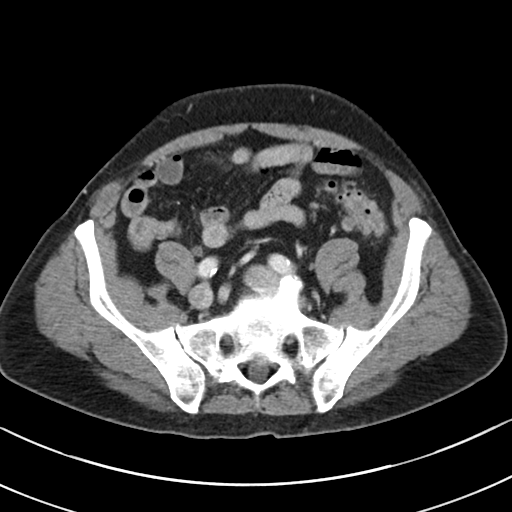
[im 43/91  soft-tissue]
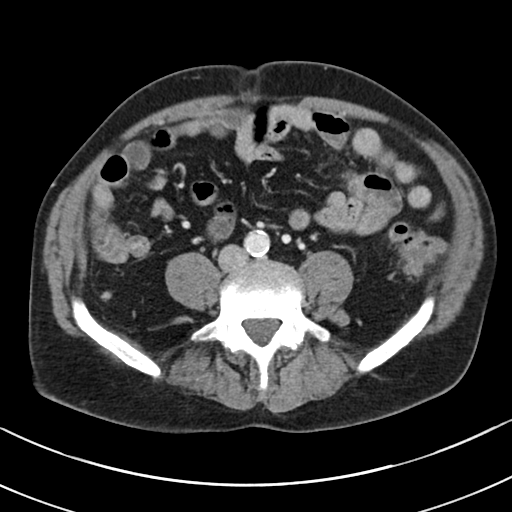
[im 48/91  soft-tissue]
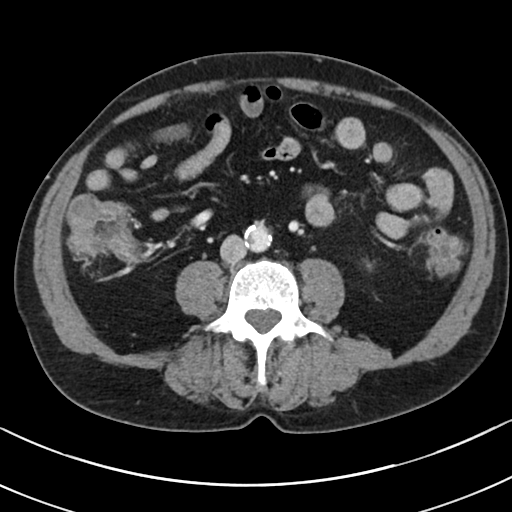
[im 57/91  soft-tissue]
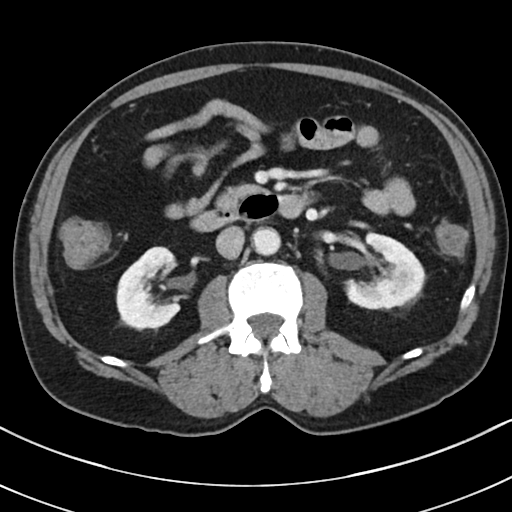
[im 62/91  soft-tissue]
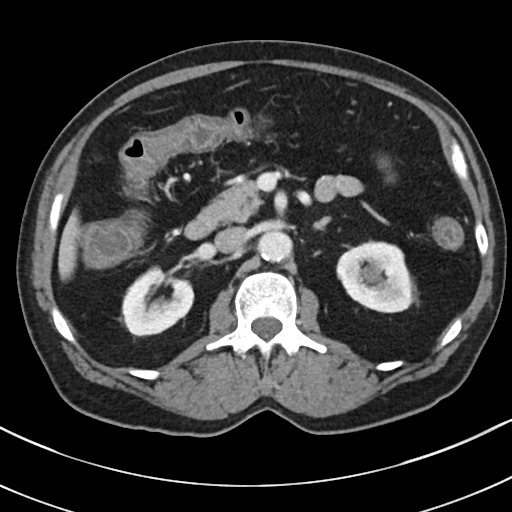
[im 62/91  bone]
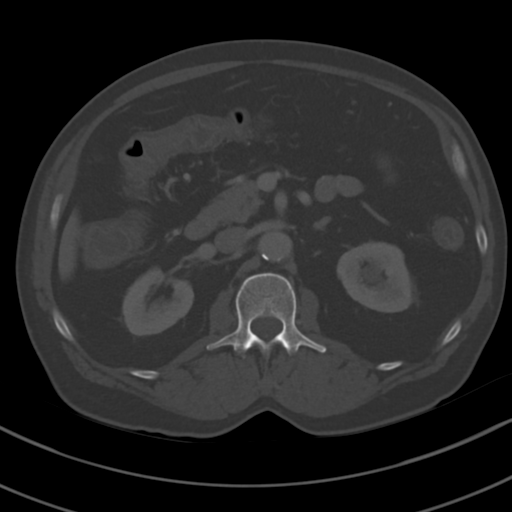
[im 72/91  soft-tissue]
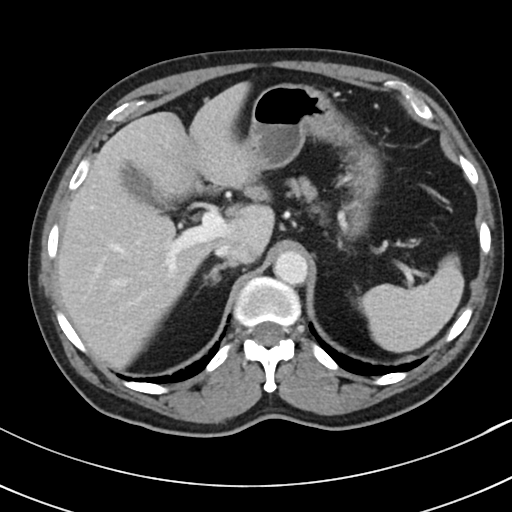
[im 76/91  soft-tissue]
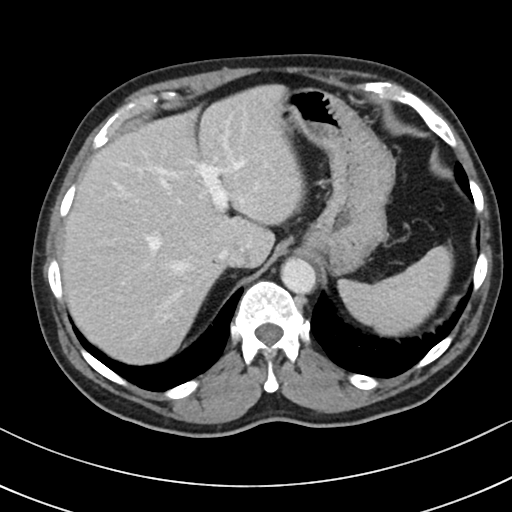
[im 86/91  soft-tissue]
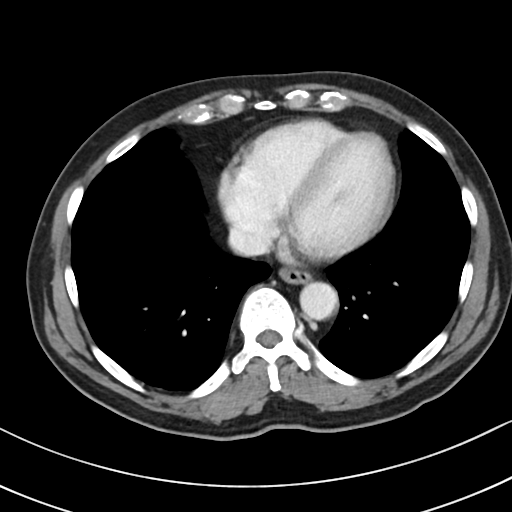

[Series 5: coronal · coronal · 0.72mm/px · 3 of 91 slices shown]
[im 31/91  soft-tissue]
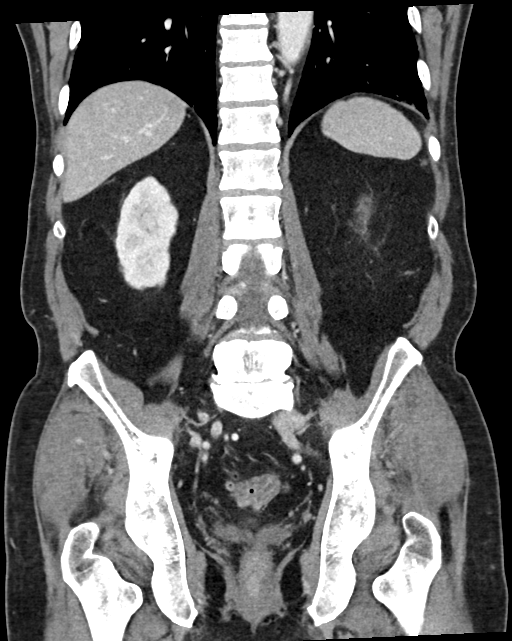
[im 41/91  soft-tissue]
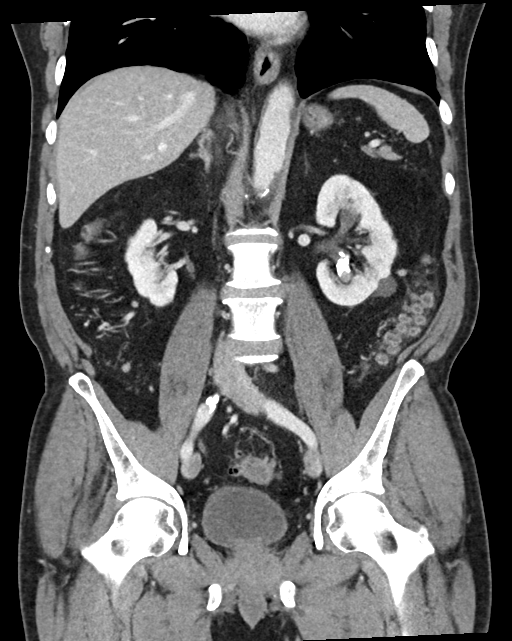
[im 51/91  soft-tissue]
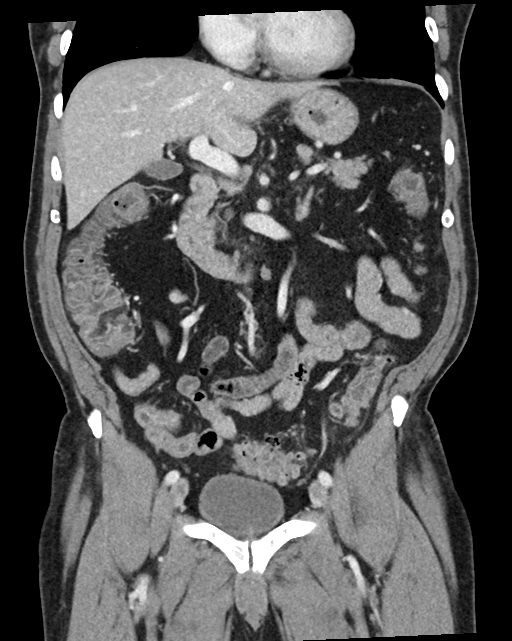

[15 of 46 positions shown; findings below may reference images not displayed]

FINDINGS: Lower chest: The lung bases are clear of acute process. No pleural
effusion or pulmonary lesions. The heart is normal in size. No
pericardial effusion. The distal esophagus and aorta are
unremarkable.

Hepatobiliary: No hepatic lesions or intrahepatic biliary
dilatation. The gallbladder is unremarkable. No common bile duct
dilatation.

Pancreas: No mass, inflammation or ductal dilatation.

Spleen: Normal size.  No focal lesions.

Adrenals/Urinary Tract: The adrenal glands are unremarkable.

Left renal calculi are noted.  No right-sided renal calculi.

There is a 5 mm calculus at the left UPJ with associated mild
hydronephrosis. No distal ureteral calculi. No right ureteral
calculi. No bladder calculi. Small bilateral renal cysts are noted.
No worrisome renal lesions. Mild trabeculation of the bladder. Small
left-sided bladder diverticulum.

Stomach/Bowel: The stomach, duodenum and small bowel are
unremarkable. No acute inflammatory process, mass lesions or
obstructive findings. The terminal ileum and appendix are normal.

There is a diffuse inflammatory or infectious colitis with mucosal
and serosal enhancement and submucosal edema. C difficile colitis
would be a possibility. Significant descending colon and sigmoid
colon diverticulosis without findings for acute diverticulitis.

Vascular/Lymphatic: Moderate atherosclerotic calcifications
involving the aorta and iliac arteries and branch vessels. No
aneurysm or dissection. The major venous structures are patent. No
mesenteric or retroperitoneal mass or adenopathy. Scattered
mesenteric and retroperitoneal lymph nodes likely reactive and due
to the colitis.

Reproductive: Prostate gland and seminal vesicles are unremarkable.

Other: No pelvic mass or adenopathy. No free pelvic fluid
collections. No inguinal mass or adenopathy. No abdominal wall
hernia or subcutaneous lesions.

Musculoskeletal: No significant bony findings.
IMPRESSION: 1. 5 mm left UPJ calculus with associated mild hydronephrosis.
2. Diffuse inflammatory or infectious colitis.
3. Left renal calculi.
4. Significant descending colon and sigmoid colon diverticulosis
without findings for acute diverticulitis.
5. Aortic atherosclerosis.

Aortic Atherosclerosis (SRYO6-OIV.V).

## 2023-01-11 ENCOUNTER — Other Ambulatory Visit: Payer: Self-pay | Admitting: Family Medicine

## 2023-01-11 DIAGNOSIS — R1084 Generalized abdominal pain: Secondary | ICD-10-CM

## 2023-02-09 ENCOUNTER — Encounter (INDEPENDENT_AMBULATORY_CARE_PROVIDER_SITE_OTHER): Payer: Self-pay

## 2023-02-21 NOTE — Patient Instructions (Signed)
It was good to see you again today, I will be in touch with your lab work  Recommend shingles vaccine series if not done already Also recommend annual flu shot, COVID booster this fall  Please stop by lab and then the ground floor for your x-rays   We will set you up for a sleep evaluation at home asap   We will see how your x-rays look and can then move towards an MRI to check out your spine as needed

## 2023-02-21 NOTE — Progress Notes (Unsigned)
[facility] [address], [address] [place], [place] [address] [phone]  Date:  02/23/2023   Name:  Paul Carlson   DOB:  [birth year]   MRN:  [id number]  PCP:  Pearline Cables, MD    Chief Complaint: No chief complaint on file.   History of Present Illness:  Paul Carlson is a 77 y.o. very pleasant male patient who presents with the following:  Patient seen today for physical exam Most recent visit with myself was in December  History of migraine headache, hypertension, hyperlipidemia, prostatitis and nephrolithiasis    He has urology care for kidney stones-they also perform his PSA screening  Colon cancer screening Shingrix Recommend COVID, flu shot this fall He had a BMP in January, otherwise labs are due for update  Gabapentin 400 at bedtime Lisinopril 5 Patient Active Problem List   Diagnosis Date Noted   Bilateral nephrolithiasis 02/24/2021   Generalized abdominal pain 04/29/2020   Hypoxia 10/17/2018   Bilateral arm numbness and tingling while sleeping 07/20/2017   History of hyperlipidemia 02/23/2016   Essential hypertension 02/23/2016   Migraine with aura and without status migrainosus, not intractable 02/19/2015    Past Medical History:  Diagnosis Date   Borderline hypertension    Cervical radiculopathy    Frequent headaches    GERD (gastroesophageal reflux disease)    Migraines     Past Surgical History:  Procedure Laterality Date   HERNIA REPAIR     LITHOTRIPSY      Social History   Tobacco Use   Smoking status: Never   Smokeless tobacco: Never  Substance Use Topics   Alcohol use: No    Family History  Problem Relation Age of Onset   Alzheimer's disease Father    Hypertension Father    Kidney disease Father    Arthritis Father    Cancer Paternal Grandfather        Prostate Cancer   Alcohol abuse Neg Hx     Allergies  Allergen Reactions   Azithromycin Other (See  Comments)    Pt. reported that it gave him severe stomach cramps.   Pollen Extract Other (See Comments)    Pt. reports nasal congestion   Penicillins Rash    Medication list has been reviewed and updated.  Current Outpatient Medications on File Prior to Visit  Medication Sig Dispense Refill   acetaminophen (TYLENOL) 650 MG CR tablet Take 650 mg by mouth at bedtime as needed for pain.     aspirin-acetaminophen-caffeine (EXCEDRIN MIGRAINE) 250-250-65 MG tablet Take 1-2 tablets by mouth as needed     gabapentin (NEURONTIN) 400 MG capsule TAKE 3 CAPSULES (1,200 MG TOTAL) BY MOUTH AT BEDTIME. 270 capsule 1   ibuprofen (ADVIL,MOTRIN) 200 MG tablet Take 600 mg by mouth as needed.     lisinopril (ZESTRIL) 5 MG tablet Take 1 tablet (5 mg total) by mouth daily. 90 tablet 1   pantoprazole (PROTONIX) 40 MG tablet Take 1 tablet (40 mg total) by mouth daily. 30 tablet 0   No current facility-administered medications on file prior to visit.    Review of Systems:  As per HPI- otherwise negative.   Physical Examination: There were no vitals filed for this visit. There were no vitals filed for this visit. There is no height or weight on file to calculate BMI. Ideal Body Weight:    GEN: no acute distress. HEENT: Atraumatic, Normocephalic.  Ears and Nose: No  external deformity. CV: RRR, No M/G/R. No JVD. No thrill. No extra heart sounds. PULM: CTA B, no wheezes, crackles, rhonchi. No retractions. No resp. distress. No accessory muscle use. ABD: S, NT, ND, +BS. No rebound. No HSM. EXTR: No c/c/e PSYCH: Normally interactive. Conversant.    Assessment and Plan: *** Physical exam today.  Encouraged healthy diet and exercise routine Will plan further follow- up pending labs.  Signed Abbe Amsterdam, MD

## 2023-02-23 ENCOUNTER — Ambulatory Visit (HOSPITAL_BASED_OUTPATIENT_CLINIC_OR_DEPARTMENT_OTHER)
Admission: RE | Admit: 2023-02-23 | Discharge: 2023-02-23 | Disposition: A | Discharge status: Home / Self Care | Payer: Medicare Other | Source: Ambulatory Visit | Attending: Family Medicine | Admitting: Family Medicine

## 2023-02-23 ENCOUNTER — Encounter: Payer: Self-pay | Admitting: Family Medicine

## 2023-02-23 ENCOUNTER — Ambulatory Visit (INDEPENDENT_AMBULATORY_CARE_PROVIDER_SITE_OTHER): Payer: Medicare Other | Admitting: Family Medicine

## 2023-02-23 VITALS — BP 110/62 | HR 60 | Temp 97.8°F | Resp 18 | Ht 67.0 in | Wt 153.4 lb

## 2023-02-23 DIAGNOSIS — R944 Abnormal results of kidney function studies: Secondary | ICD-10-CM

## 2023-02-23 DIAGNOSIS — R0683 Snoring: Secondary | ICD-10-CM | POA: Diagnosis not present

## 2023-02-23 DIAGNOSIS — M79601 Pain in right arm: Secondary | ICD-10-CM | POA: Insufficient documentation

## 2023-02-23 DIAGNOSIS — Z0000 Encounter for general adult medical examination without abnormal findings: Secondary | ICD-10-CM

## 2023-02-23 DIAGNOSIS — R7303 Prediabetes: Secondary | ICD-10-CM

## 2023-02-23 DIAGNOSIS — M5010 Cervical disc disorder with radiculopathy, unspecified cervical region: Secondary | ICD-10-CM | POA: Diagnosis not present

## 2023-02-23 DIAGNOSIS — E875 Hyperkalemia: Secondary | ICD-10-CM

## 2023-02-23 DIAGNOSIS — Z131 Encounter for screening for diabetes mellitus: Secondary | ICD-10-CM

## 2023-02-23 DIAGNOSIS — M79602 Pain in left arm: Secondary | ICD-10-CM | POA: Insufficient documentation

## 2023-02-23 DIAGNOSIS — Z8639 Personal history of other endocrine, nutritional and metabolic disease: Secondary | ICD-10-CM | POA: Diagnosis not present

## 2023-02-23 DIAGNOSIS — M4184 Other forms of scoliosis, thoracic region: Secondary | ICD-10-CM | POA: Diagnosis not present

## 2023-02-23 DIAGNOSIS — I10 Essential (primary) hypertension: Secondary | ICD-10-CM

## 2023-02-23 DIAGNOSIS — M47812 Spondylosis without myelopathy or radiculopathy, cervical region: Secondary | ICD-10-CM | POA: Diagnosis not present

## 2023-02-23 LAB — LIPID PANEL
Cholesterol: 204 mg/dL — ABNORMAL HIGH (ref 0–200)
HDL: 43 mg/dL (ref >39.00)
LDL Cholesterol: 133 mg/dL — ABNORMAL HIGH (ref 0–99)
NonHDL: 160.63
Total CHOL/HDL Ratio: 5
Triglycerides: 137 mg/dL (ref 0.0–149.0)
VLDL: 27.4 mg/dL (ref 0.0–40.0)

## 2023-02-23 LAB — COMPREHENSIVE METABOLIC PANEL
ALT: 14 U/L (ref 0–53)
AST: 18 U/L (ref 0–37)
Albumin: 4.5 g/dL (ref 3.5–5.2)
Alkaline Phosphatase: 68 U/L (ref 39–117)
BUN: 19 mg/dL (ref 6–23)
CO2: 30 mEq/L (ref 19–32)
Calcium: 9.8 mg/dL (ref 8.4–10.5)
Chloride: 102 mEq/L (ref 96–112)
Creatinine, Ser: 1.34 mg/dL (ref 0.40–1.50)
GFR: 51.99 mL/min — ABNORMAL LOW (ref >60.00)
Glucose, Bld: 86 mg/dL (ref 70–99)
Potassium: 5.4 mEq/L — ABNORMAL HIGH (ref 3.5–5.1)
Sodium: 137 mEq/L (ref 135–145)
Total Bilirubin: 0.8 mg/dL (ref 0.2–1.2)
Total Protein: 7 g/dL (ref 6.0–8.3)

## 2023-02-23 LAB — CBC
HCT: 45.5 % (ref 39.0–52.0)
Hemoglobin: 14.8 g/dL (ref 13.0–17.0)
MCHC: 32.5 g/dL (ref 30.0–36.0)
MCV: 94 fl (ref 78.0–100.0)
Platelets: 221 10*3/uL (ref 150.0–400.0)
RBC: 4.84 Mil/uL (ref 4.22–5.81)
RDW: 13.7 % (ref 11.5–15.5)
WBC: 6.5 10*3/uL (ref 4.0–10.5)

## 2023-02-23 LAB — HEMOGLOBIN A1C: Hgb A1c MFr Bld: 6.1 % (ref 4.6–6.5)

## 2023-02-23 MED ORDER — GABAPENTIN 400 MG PO CAPS
1200.0000 mg | ORAL_CAPSULE | Freq: Every day | ORAL | 1 refills | Status: DC
Start: 2023-02-23 — End: 2023-09-05

## 2023-02-23 MED ORDER — LISINOPRIL 5 MG PO TABS
5.0000 mg | ORAL_TABLET | Freq: Every day | ORAL | 3 refills | Status: DC
Start: 2023-02-23 — End: 2024-05-09

## 2023-02-25 ENCOUNTER — Other Ambulatory Visit: Payer: Self-pay | Admitting: Family Medicine

## 2023-02-25 DIAGNOSIS — R1084 Generalized abdominal pain: Secondary | ICD-10-CM

## 2023-02-26 MED ORDER — PREDNISONE 20 MG PO TABS: ORAL_TABLET | ORAL | 0 refills | Status: DC

## 2023-02-26 MED ORDER — ROSUVASTATIN CALCIUM 10 MG PO TABS
10.0000 mg | ORAL_TABLET | Freq: Every day | ORAL | 3 refills | Status: DC
Start: 2023-02-26 — End: 2024-02-16

## 2023-03-15 ENCOUNTER — Other Ambulatory Visit (INDEPENDENT_AMBULATORY_CARE_PROVIDER_SITE_OTHER): Payer: Medicare Other

## 2023-03-15 ENCOUNTER — Encounter: Payer: Self-pay | Admitting: Family Medicine

## 2023-03-15 DIAGNOSIS — E875 Hyperkalemia: Secondary | ICD-10-CM

## 2023-03-15 DIAGNOSIS — N1831 Chronic kidney disease, stage 3a: Secondary | ICD-10-CM

## 2023-03-15 LAB — BASIC METABOLIC PANEL
BUN: 27 mg/dL — ABNORMAL HIGH (ref 6–23)
CO2: 27 meq/L (ref 19–32)
Calcium: 9.9 mg/dL (ref 8.4–10.5)
Chloride: 101 meq/L (ref 96–112)
Creatinine, Ser: 1.47 mg/dL (ref 0.40–1.50)
GFR: 46.5 mL/min — ABNORMAL LOW (ref >60.00)
Glucose, Bld: 107 mg/dL — ABNORMAL HIGH (ref 70–99)
Potassium: 4.7 meq/L (ref 3.5–5.1)
Sodium: 136 meq/L (ref 135–145)

## 2023-07-13 ENCOUNTER — Emergency Department (HOSPITAL_BASED_OUTPATIENT_CLINIC_OR_DEPARTMENT_OTHER): Payer: Medicare Other

## 2023-07-13 ENCOUNTER — Emergency Department (HOSPITAL_BASED_OUTPATIENT_CLINIC_OR_DEPARTMENT_OTHER)
Admission: EM | Admit: 2023-07-13 | Discharge: 2023-07-13 | Disposition: A | Discharge status: Home / Self Care | Payer: Medicare Other | Attending: Emergency Medicine | Admitting: Emergency Medicine

## 2023-07-13 ENCOUNTER — Other Ambulatory Visit: Payer: Self-pay

## 2023-07-13 ENCOUNTER — Encounter (HOSPITAL_BASED_OUTPATIENT_CLINIC_OR_DEPARTMENT_OTHER): Payer: Self-pay

## 2023-07-13 DIAGNOSIS — M7989 Other specified soft tissue disorders: Secondary | ICD-10-CM | POA: Insufficient documentation

## 2023-07-13 DIAGNOSIS — M79672 Pain in left foot: Secondary | ICD-10-CM | POA: Insufficient documentation

## 2023-07-13 DIAGNOSIS — M25572 Pain in left ankle and joints of left foot: Secondary | ICD-10-CM | POA: Insufficient documentation

## 2023-07-13 DIAGNOSIS — R2242 Localized swelling, mass and lump, left lower limb: Secondary | ICD-10-CM | POA: Diagnosis not present

## 2023-07-13 DIAGNOSIS — M7732 Calcaneal spur, left foot: Secondary | ICD-10-CM | POA: Diagnosis not present

## 2023-07-13 DIAGNOSIS — M79605 Pain in left leg: Secondary | ICD-10-CM | POA: Diagnosis not present

## 2023-07-13 MED ORDER — DOXYCYCLINE HYCLATE 100 MG PO CAPS: 100.0000 mg | ORAL_CAPSULE | Freq: Two times a day (BID) | ORAL | 0 refills | Status: DC

## 2023-07-13 MED ORDER — MELOXICAM 7.5 MG PO TABS: 7.5000 mg | ORAL_TABLET | Freq: Every day | ORAL | 0 refills | Status: DC

## 2023-07-13 NOTE — ED Provider Notes (Signed)
 [facility] Provider Note   CSN: [id number] Arrival date & time: 07/13/23  1147     History  Chief Complaint  Patient presents with   Foot Pain    Paul Carlson is a 78 y.o. male.  Patient is a 78 year old male who presents with pain and swelling in his left foot and ankle for about the last 3 days.  He denies any known injury although he says he has been doing some remodeling of his house and has been a lot more active on it than he normally is.  He has had prior issues with his shoulder joints but no other ongoing joint issues.  He denies any fevers.  No wounds to the leg.  He said the pain is going up into his calf.  No chest pain or shortness of breath.       Home Medications Prior to Admission medications   Medication Sig Start Date End Date Taking? Authorizing Provider  doxycycline  (VIBRAMYCIN ) 100 MG capsule Take 1 capsule (100 mg total) by mouth 2 (two) times daily. One po bid x 7 days 07/13/23  Yes Lenor Hollering, MD  meloxicam  (MOBIC ) 7.5 MG tablet Take 1 tablet (7.5 mg total) by mouth daily. 07/13/23  Yes Lenor Hollering, MD  acetaminophen  (TYLENOL ) 650 MG CR tablet Take 650 mg by mouth at bedtime as needed for pain.    [provider]  aspirin-acetaminophen -caffeine (EXCEDRIN MIGRAINE) 250-250-65 MG tablet Take 1-2 tablets by mouth as needed    [provider]  gabapentin  (NEURONTIN ) 400 MG capsule Take 3 capsules (1,200 mg total) by mouth at bedtime. 02/23/23   Copland, Jessica C, MD  ibuprofen (ADVIL,MOTRIN) 200 MG tablet Take 600 mg by mouth as needed.    [provider]  lisinopril  (ZESTRIL ) 5 MG tablet Take 1 tablet (5 mg total) by mouth daily. 02/23/23   Copland, Harlene BROCKS, MD  pantoprazole  (PROTONIX ) 40 MG tablet Take 1 tablet (40 mg total) by mouth daily. 02/25/23   Copland, Harlene BROCKS, MD  predniSONE  (DELTASONE ) 20 MG tablet Take 40 mg by mouth daily for 3 days, then 20 mg by mouth daily for 3 days  02/26/23   Copland, Harlene BROCKS, MD  rosuvastatin  (CRESTOR ) 10 MG tablet Take 1 tablet (10 mg total) by mouth daily. 02/26/23   Copland, Harlene BROCKS, MD      Allergies    Azithromycin, Pollen extract, and Penicillins    Review of Systems   Review of Systems  Constitutional:  Negative for fever.  Respiratory:  Negative for shortness of breath.   Cardiovascular:  Negative for chest pain.  Gastrointestinal:  Negative for nausea and vomiting.  Musculoskeletal:  Positive for arthralgias and joint swelling. Negative for back pain and neck pain.  Skin:  Negative for wound.  Neurological:  Negative for weakness, numbness and headaches.    Physical Exam Updated Vital Signs BP 125/82 (BP Location: Left Arm)   Pulse 61   Temp 97.8 F (36.6 C) (Oral)   Resp 18   SpO2 100%  Physical Exam Constitutional:      Appearance: He is well-developed.  HENT:     Head: Normocephalic and atraumatic.  Cardiovascular:     Rate and Rhythm: Normal rate.  Pulmonary:     Effort: Pulmonary effort is normal.  Musculoskeletal:        General: Tenderness present.     Cervical back: Normal range of motion and neck supple.  Comments: Positive swelling over the medial malleolus of the left ankle.  There is some tenderness over this area and over the calcaneus.  There is also some mild calf tenderness.  Pedal pulses are intact.  There is no warmth or erythema to the area.  No wounds.  Skin:    General: Skin is warm and dry.  Neurological:     Mental Status: He is alert and oriented to person, place, and time.     ED Results / Procedures / Treatments   Labs (all labs ordered are listed, but only abnormal results are displayed) Labs Reviewed - No data to display  EKG None  Radiology DG Ankle Complete Left Result Date: 07/13/2023 CLINICAL DATA:  Left ankle and foot pain.  No injury. EXAM: LEFT ANKLE COMPLETE - 3+ VIEW COMPARISON:  None Available. FINDINGS: Ankle mortise is normal. No acute fracture or  dislocation. Small inferior calcaneal spur. Soft tissues are unremarkable. IMPRESSION: 1. No acute findings. 2. Small inferior calcaneal spur. Electronically Signed   By: Toribio Agreste M.D.   On: 07/13/2023 13:55   DG Foot Complete Left Result Date: 07/13/2023 CLINICAL DATA:  Left ankle and foot pain.  No injury. EXAM: LEFT FOOT - COMPLETE 3+ VIEW COMPARISON:  None Available. FINDINGS: Examination demonstrates no evidence of acute fracture or dislocation. Small inferior calcaneal spur. Remainder of the exam is unremarkable. IMPRESSION: 1. No acute findings. 2. Small inferior calcaneal spur. Electronically Signed   By: Toribio Agreste M.D.   On: 07/13/2023 13:55   US  Venous Img Lower Unilateral Left Result Date: 07/13/2023 CLINICAL DATA:  Left lower extremity pain and swelling. EXAM: LEFT LOWER EXTREMITY VENOUS DOPPLER ULTRASOUND TECHNIQUE: Gray-scale sonography with compression, as well as color and duplex ultrasound, were performed to evaluate the deep venous system(s) from the level of the common femoral vein through the popliteal and proximal calf veins. COMPARISON:  None Available. FINDINGS: VENOUS Normal compressibility of the common femoral, superficial femoral, and popliteal veins, as well as the visualized calf veins. Visualized portions of profunda femoral vein and great saphenous vein unremarkable. No filling defects to suggest DVT on grayscale or color Doppler imaging. Doppler waveforms show normal direction of venous flow, normal respiratory plasticity and response to augmentation. Limited views of the contralateral common femoral vein are unremarkable. OTHER None. Limitations: none IMPRESSION: Negative. Electronically Signed   By: Camellia Candle M.D.   On: 07/13/2023 13:50    Procedures Procedures    Medications Ordered in ED Medications - No data to display  ED Course/ Medical Decision Making/ A&P                                 Medical Decision Making Amount and/or Complexity of Data  Reviewed Radiology: ordered.  Risk Prescription drug management.   Patient is a 78 year old who presents with pain and swelling in his left medial foot and ankle.  He does have some pain extending into his calf.  Ultrasound was performed which shows no evidence of DVT.  He is not having a fever.  He does not appear systemically ill.  I suspect this is inflammation, possibly related to his bone spur or arthritis.  However on reexam, there is some mild erythema to the medial aspect of the joint.  He does not have circumferential swelling of the joint.  Given that, I have a low suspicion for septic arthritis.  Appears to be well localized to  the medial aspect of the joint.  Will go ahead and cover for possible cellulitis.  He was discharged home in good condition.  He was given a boot to wear for comfort and given a walker to use.  He was advised in ice and elevation.  He was started on Mobic  and doxycycline .  He was encouraged to have close follow-up with his primary care doctor.  Return precautions were given.  Final Clinical Impression(s) / ED Diagnoses Final diagnoses:  Foot pain, left    Rx / DC Orders ED Discharge Orders          Ordered    meloxicam  (MOBIC ) 7.5 MG tablet  Daily        07/13/23 1513    doxycycline  (VIBRAMYCIN ) 100 MG capsule  2 times daily        07/13/23 1513              Lenor Hollering, MD 07/13/23 1517

## 2023-07-13 NOTE — ED Triage Notes (Signed)
 Pt c/o left foot pain and swelling going into his knee for the past three days. Pt took Tylenol and ibuprofen and it helped with the pain.

## 2023-07-15 ENCOUNTER — Telehealth: Payer: Self-pay | Admitting: *Deleted

## 2023-07-15 LAB — COMPREHENSIVE METABOLIC PANEL
Albumin: 4.6 (ref 3.5–5.0)
Calcium: 10.2 (ref 8.7–10.7)
eGFR: 63

## 2023-07-15 LAB — BASIC METABOLIC PANEL
BUN: 16 (ref 4–21)
CO2: 28 — AB (ref 13–22)
Chloride: 104 (ref 99–108)
Creatinine: 1.2 (ref 0.6–1.3)
Glucose: 81
Potassium: 5.5 meq/L — AB (ref 3.5–5.1)
Sodium: 141 (ref 137–147)

## 2023-07-15 NOTE — Transitions of Care (Post Inpatient/ED Visit) (Signed)
   07/15/2023  Name: Paul Carlson MRN: [id number] DOB: Jul 31, 1945  Today's TOC FU Call Status: Today's TOC FU Call Status:: Successful TOC FU Call Completed TOC FU Call Complete Date: 07/15/23 Patient's Name and Date of Birth confirmed.  Transition Care Management Follow-up Telephone Call Date of Discharge: 07/13/23 Discharge Facility: [facility] Type of Discharge: Emergency Department Reason for ED Visit: Other: (left foot pain/swelling) How have you been since you were released from the hospital?: Same Any questions or concerns?: No  Items Reviewed: Did you receive and understand the discharge instructions provided?: Yes Medications obtained,verified, and reconciled?: Yes (Medications Reviewed) Any new allergies since your discharge?: No Dietary orders reviewed?: NA Do you have support at home?: Yes People in Home: spouse  Medications Reviewed Today: Medications Reviewed Today   Medications were not reviewed in this encounter     Home Care and Equipment/Supplies: Were Home Health Services Ordered?: NA Any new equipment or medical supplies ordered?: NA  Functional Questionnaire: Do you need assistance with bathing/showering or dressing?: No Do you need assistance with meal preparation?: No Do you need assistance with eating?: No Do you have difficulty maintaining continence: No Do you need assistance with getting out of bed/getting out of a chair/moving?: No Do you have difficulty managing or taking your medications?: No  Follow up appointments reviewed: PCP Follow-up appointment confirmed?: Yes Date of PCP follow-up appointment?: 07/20/23 Follow-up Provider: Dr. Harlene [facility] Follow-up appointment confirmed?: NA Do you need transportation to your follow-up appointment?: No Do you understand care options if your condition(s) worsen?: Yes-patient verbalized understanding    Jeoffrey Lease, CMA

## 2023-07-17 NOTE — Patient Instructions (Addendum)
 It was good to see you again today If not done already, recommend the shingles vaccine series and a dose of RSV-as well as update COVID coverage if not done the last 6 months or so Flu shot given today  I suspect you have a stress reaction of the calcaneus, possibly also with an element of plantar fasciitis in the left foot.  This is likely caused by a sharp increase in physical activity during your home remodeling project.  Please try to relatively rest the foot-you do not have to stay off of it totally, but limit physical activity, no walking for exercise until symptoms are completely resolved.  Use your walking boot as needed for support.  Use meloxicam  just as needed for symptoms  Let see how things do the next couple of weeks.  If you are getting back to normal that is great.  However, if symptoms continue we will have you see an orthopedist to look a bit further.  Let me know sooner if getting worse

## 2023-07-17 NOTE — Progress Notes (Signed)
 [facility] [address], [address] [place], [place] [address] [phone]  Date:  07/20/2023   Name:  Paul Carlson   DOB:  06/14/1946   MRN:  [id number]  PCP:  Watt Harlene BROCKS, MD    Chief Complaint: ER follow up (Foot pain- Meloxicam  7.5 mg, Doxy 100  mg BID x 7 days. /Concerns/ questions: foot still hurts)   History of Present Illness:  Paul Carlson is a 78 y.o. very pleasant male patient who presents with the following:  Patient seen today for follow-up from a recent ER visit for foot pain Most recently seen by myself in August for his physical History of migraine headache, hypertension, hyperlipidemia, prostatitis and nephrolithiasis, prediabetes   He has urology care for kidney stones-they also perform his PSA screening  He was seen in the ER on January 1 with pain and swelling in the left foot and ankle for about 3 days He was evaluated for DVT with ultrasound was negative He was covered for cellulitis with doxycycline , given meloxicam  and asked to follow-up  He notes he was doing more activity than normal while doing some remodeling on his house-he is not aware of any injury in particular.  However he describes a 3 to 4-day period in which she would rise early and spent all day on his feet working vigorously.  He did this until pain in his foot caused him to stop!  The pain was mostly in the heel portion of the left foot On 1/1 he could not put weight on the heel so we went to the ER At this point the pain is significantly improved, although it seems more widespread-  He is using a walking boot- he tried going without it over the weekend and the pain got worse so he went back to wearing the boot The right foot is ok  Never had this in the past   Flu vaccine- give today  Colon cancer screening may be due- he is working on this, he had a GI doctor he can see   Imaging from the ER:  DG Ankle Complete Left Result  Date: 07/13/2023 CLINICAL DATA:  Left ankle and foot pain.  No injury. EXAM: LEFT ANKLE COMPLETE - 3+ VIEW COMPARISON:  None Available. FINDINGS: Ankle mortise is normal. No acute fracture or dislocation. Small inferior calcaneal spur. Soft tissues are unremarkable. IMPRESSION: 1. No acute findings. 2. Small inferior calcaneal spur. Electronically Signed   By: Toribio Agreste M.D.   On: 07/13/2023 13:55    DG Foot Complete Left Result Date: 07/13/2023 CLINICAL DATA:  Left ankle and foot pain.  No injury. EXAM: LEFT FOOT - COMPLETE 3+ VIEW COMPARISON:  None Available. FINDINGS: Examination demonstrates no evidence of acute fracture or dislocation. Small inferior calcaneal spur. Remainder of the exam is unremarkable. IMPRESSION: 1. No acute findings. 2. Small inferior calcaneal spur. Electronically Signed   By: Toribio Agreste M.D.   On: 07/13/2023 13:55    US  Venous Img Lower Unilateral Left Result Date: 07/13/2023 CLINICAL DATA:  Left lower extremity pain and swelling. EXAM: LEFT LOWER EXTREMITY VENOUS DOPPLER ULTRASOUND TECHNIQUE: Gray-scale sonography with compression, as well as color and duplex ultrasound, were performed to evaluate the deep venous system(s) from the level of the common femoral vein through the popliteal and proximal calf veins. COMPARISON:  None Available. FINDINGS: VENOUS Normal compressibility of the common femoral, superficial femoral, and popliteal veins, as well as  the visualized calf veins. Visualized portions of profunda femoral vein and great saphenous vein unremarkable. No filling defects to suggest DVT on grayscale or color Doppler imaging. Doppler waveforms show normal direction of venous flow, normal respiratory plasticity and response to augmentation. Limited views of the contralateral common femoral vein are unremarkable. OTHER None. Limitations: none IMPRESSION: Negative. Electronically Signed   By: Camellia Candle M.D.   On: 07/13/2023 13:50   Patient Active Problem List    Diagnosis Date Noted   Prediabetes 02/23/2023   Bilateral nephrolithiasis 02/24/2021   Generalized abdominal pain 04/29/2020   Hypoxia 10/17/2018   Bilateral arm numbness and tingling while sleeping 07/20/2017   History of hyperlipidemia 02/23/2016   Essential hypertension 02/23/2016   Migraine with aura and without status migrainosus, not intractable 02/19/2015    Past Medical History:  Diagnosis Date   Borderline hypertension    Cervical radiculopathy    Frequent headaches    GERD (gastroesophageal reflux disease)    Migraines     Past Surgical History:  Procedure Laterality Date   HERNIA REPAIR     LITHOTRIPSY      Social History   Tobacco Use   Smoking status: Never   Smokeless tobacco: Never  Substance Use Topics   Alcohol use: No    Family History  Problem Relation Age of Onset   Alzheimer's disease Father    Hypertension Father    Kidney disease Father    Arthritis Father    Cancer Paternal Grandfather        Prostate Cancer   Alcohol abuse Neg Hx     Allergies  Allergen Reactions   Azithromycin Other (See Comments)    Pt. reported that it gave him severe stomach cramps.   Pollen Extract Other (See Comments)    Pt. reports nasal congestion   Penicillins Rash    Medication list has been reviewed and updated.  Current Outpatient Medications on File Prior to Visit  Medication Sig Dispense Refill   acetaminophen  (TYLENOL ) 650 MG CR tablet Take 650 mg by mouth at bedtime as needed for pain.     aspirin-acetaminophen -caffeine (EXCEDRIN MIGRAINE) 250-250-65 MG tablet Take 1-2 tablets by mouth as needed     gabapentin  (NEURONTIN ) 400 MG capsule Take 3 capsules (1,200 mg total) by mouth at bedtime. 270 capsule 1   ibuprofen (ADVIL,MOTRIN) 200 MG tablet Take 600 mg by mouth as needed.     lisinopril  (ZESTRIL ) 5 MG tablet Take 1 tablet (5 mg total) by mouth daily. 90 tablet 3   meloxicam  (MOBIC ) 7.5 MG tablet Take 1 tablet (7.5 mg total) by mouth daily. 30  tablet 0   pantoprazole  (PROTONIX ) 40 MG tablet Take 1 tablet (40 mg total) by mouth daily. 90 tablet 1   rosuvastatin  (CRESTOR ) 10 MG tablet Take 1 tablet (10 mg total) by mouth daily. 90 tablet 3   No current facility-administered medications on file prior to visit.    Review of Systems:  As per HPI- otherwise negative.'  Physical Examination: Vitals:   07/20/23 1508  BP: 130/60  Pulse: 72  Resp: 18  Temp: 98.4 F (36.9 C)  SpO2: 98%   Vitals:   07/20/23 1508  Weight: 160 lb 3.2 oz (72.7 kg)  Height: 5' 7 (1.702 m)   Body mass index is 25.09 kg/m. Ideal Body Weight: Weight in (lb) to have BMI = 25: 159.3  GEN: no acute distress.  Normal weight, looks well HEENT: Atraumatic, Normocephalic.  Ears and Nose: No  external deformity. CV: RRR, No M/G/R. No JVD. No thrill. No extra heart sounds. PULM: CTA B, no wheezes, crackles, rhonchi. No retractions. No resp. distress. No accessory muscle use. EXTR: No c/c/e PSYCH: Normally interactive. Conversant.  Left lower extremity: Knee, ankle normal.  Foot is visually normal, no redness or swelling.  Normal pulses.  Negative squeeze test for Morton's neuroma.  Patient does have some discomfort however with squeezing the calcaneus and pressing on the cannula attachment of the plantar fascia.  Achilles is intact and nontender  Assessment and Plan: Immunization due - Plan: Flu Vaccine Trivalent High Dose (Fluad)  Left foot pain  Patient seen today with pain in the left foot.  He was actually seen in the ER for this on January 1.  Since that time he has been using meloxicam  and also wearing a walking boot.  He took doxycycline  in case there was concern of cellulitis although this seems less likely at this point.  Pain came on after he spent several days doing much more physical activity and standing/walking than he is used to.  I suspect he had a stress reaction in the calcaneus and/or some element of plantar fasciitis.  Because he is  already significantly better we decided to have him continue wearing the walking boot as needed for support and comfort.  I urged him to practice relative rest to allow the foot to heal.  If it is not making good improvement and getting back to normal over the next 2 weeks we will have him see orthopedics, sooner if getting worse  Updated flu vaccine Signed Harlene Schroeder, MD

## 2023-07-20 ENCOUNTER — Ambulatory Visit: Payer: Medicare Other | Admitting: Family Medicine

## 2023-07-20 VITALS — BP 130/60 | HR 72 | Temp 98.4°F | Resp 18 | Ht 67.0 in | Wt 160.2 lb

## 2023-07-20 DIAGNOSIS — M79672 Pain in left foot: Secondary | ICD-10-CM | POA: Diagnosis not present

## 2023-07-20 DIAGNOSIS — Z23 Encounter for immunization: Secondary | ICD-10-CM

## 2023-07-25 DIAGNOSIS — J069 Acute upper respiratory infection, unspecified: Secondary | ICD-10-CM | POA: Diagnosis not present

## 2023-07-25 DIAGNOSIS — R0981 Nasal congestion: Secondary | ICD-10-CM | POA: Diagnosis not present

## 2023-07-25 DIAGNOSIS — R051 Acute cough: Secondary | ICD-10-CM | POA: Diagnosis not present

## 2023-07-25 DIAGNOSIS — G43109 Migraine with aura, not intractable, without status migrainosus: Secondary | ICD-10-CM | POA: Diagnosis not present

## 2023-07-28 ENCOUNTER — Ambulatory Visit: Payer: Medicare Other

## 2023-07-28 VITALS — Ht 67.0 in | Wt 160.0 lb

## 2023-07-28 DIAGNOSIS — Z0000 Encounter for general adult medical examination without abnormal findings: Secondary | ICD-10-CM | POA: Diagnosis not present

## 2023-07-28 NOTE — Patient Instructions (Addendum)
Mr. Broomfield , Thank you for taking time to come for your Medicare Wellness Visit. I appreciate your ongoing commitment to your health goals. Please review the following plan we discussed and let me know if I can assist you in the future.   Referrals/Orders/Follow-Ups/Clinician Recommendations:   This is a list of the screening recommended for you and due dates:  Health Maintenance  Topic Date Due   Zoster (Shingles) Vaccine (1 of 2) Never done   Colon Cancer Screening  11/08/2020   COVID-19 Vaccine (4 - 2024-25 season) 03/13/2023   Medicare Annual Wellness Visit  07/27/2024   DTaP/Tdap/Td vaccine (2 - Td or Tdap) 02/18/2025   Pneumonia Vaccine  Completed   Flu Shot  Completed   Hepatitis C Screening  Completed   HPV Vaccine  Aged Out    Advanced directives: (Declined) Advance directive discussed with you today. Even though you declined this today, please call our office should you change your mind, and we can give you the proper paperwork for you to fill out.  Next Medicare Annual Wellness Visit scheduled for next year: Yes

## 2023-07-28 NOTE — Progress Notes (Signed)
Subjective:   Paul Carlson is a 78 y.o. male who presents for Medicare Annual/Subsequent preventive examination.  Visit Complete: Virtual I connected with  Glorianne Manchester on 07/28/23 by a audio enabled telemedicine application and verified that I am speaking with the correct person using two identifiers.  Patient Location: Home  Provider Location: Home Office  I discussed the limitations of evaluation and management by telemedicine. The patient expressed understanding and agreed to proceed.  Vital Signs: Because this visit was a virtual/telehealth visit, some criteria may be missing or patient reported. Any vitals not documented were not able to be obtained and vitals that have been documented are patient reported.    Cardiac Risk Factors include: advanced age (>68men, >24 women);male gender;hypertension     Objective:    Today's Vitals   07/28/23 0930 07/28/23 0931  Weight: 160 lb (72.6 kg)   Height: 5\' 7"  (1.702 m)   PainSc:  0-No pain   Body mass index is 25.06 kg/m.     07/28/2023    9:40 AM 07/26/2022    9:04 AM 07/23/2021    3:10 PM 03/31/2021    3:42 PM 01/16/2021    2:06 PM 07/10/2020   10:38 AM 06/29/2019   11:09 AM  Advanced Directives  Does Patient Have a Medical Advance Directive? No No No No No No No  Would patient like information on creating a medical advance directive? No - Patient declined No - Patient declined Yes (MAU/Ambulatory/Procedural Areas - Information given)  No - Patient declined Yes (MAU/Ambulatory/Procedural Areas - Information given) No - Patient declined    Current Medications (verified) Outpatient Encounter Medications as of 07/28/2023  Medication Sig   acetaminophen (TYLENOL) 650 MG CR tablet Take 650 mg by mouth at bedtime as needed for pain.   aspirin-acetaminophen-caffeine (EXCEDRIN MIGRAINE) 250-250-65 MG tablet Take 1-2 tablets by mouth as needed   gabapentin (NEURONTIN) 400 MG capsule Take 3 capsules (1,200 mg total) by mouth at  bedtime.   ibuprofen (ADVIL,MOTRIN) 200 MG tablet Take 600 mg by mouth as needed.   lisinopril (ZESTRIL) 5 MG tablet Take 1 tablet (5 mg total) by mouth daily.   meloxicam (MOBIC) 7.5 MG tablet Take 1 tablet (7.5 mg total) by mouth daily.   pantoprazole (PROTONIX) 40 MG tablet Take 1 tablet (40 mg total) by mouth daily.   rosuvastatin (CRESTOR) 10 MG tablet Take 1 tablet (10 mg total) by mouth daily.   No facility-administered encounter medications on file as of 07/28/2023.    Allergies (verified) Azithromycin, Pollen extract, and Penicillins   History: Past Medical History:  Diagnosis Date   Borderline hypertension    Cervical radiculopathy    Frequent headaches    GERD (gastroesophageal reflux disease)    Migraines    Past Surgical History:  Procedure Laterality Date   HERNIA REPAIR     LITHOTRIPSY     Family History  Problem Relation Age of Onset   Alzheimer's disease Father    Hypertension Father    Kidney disease Father    Arthritis Father    Cancer Paternal Grandfather        Prostate Cancer   Alcohol abuse Neg Hx    Social History   Socioeconomic History   Marital status: Married    Spouse name: Not on file   Number of children: Not on file   Years of education: Not on file   Highest education level: Bachelor's degree (e.g., BA, AB, BS)  Occupational History  Occupation: Education officer, environmental  Tobacco Use   Smoking status: Never   Smokeless tobacco: Never  Substance and Sexual Activity   Alcohol use: No   Drug use: Not on file   Sexual activity: Not on file  Other Topics Concern   Not on file  Social History Narrative   Not on file   Social Drivers of Health   Financial Resource Strain: Low Risk  (07/28/2023)   Overall Financial Resource Strain (CARDIA)    Difficulty of Paying Living Expenses: Not hard at all  Food Insecurity: No Food Insecurity (07/28/2023)   Hunger Vital Sign    Worried About Running Out of Food in the Last Year: Never true    Ran Out of  Food in the Last Year: Never true  Transportation Needs: No Transportation Needs (07/28/2023)   PRAPARE - Administrator, Civil Service (Medical): No    Lack of Transportation (Non-Medical): No  Physical Activity: Inactive (07/28/2023)   Exercise Vital Sign    Days of Exercise per Week: 0 days    Minutes of Exercise per Session: 0 min  Stress: No Stress Concern Present (07/28/2023)   [employer] of Occupational Health - Occupational Stress Questionnaire    Feeling of Stress : Not at all  Social Connections: Socially Integrated (07/28/2023)   Social Connection and Isolation Panel [NHANES]    Frequency of Communication with Friends and Family: More than three times a week    Frequency of Social Gatherings with Friends and Family: More than three times a week    Attends Religious Services: More than 4 times per year    Active Member of [employer] or Organizations: Yes    Attends Engineer, structural: More than 4 times per year    Marital Status: Married    Tobacco Counseling Counseling given: Not Answered   Clinical Intake:  Pre-visit preparation completed: Yes  Pain : No/denies pain Pain Score: 0-No pain     BMI - recorded: 25.06 Nutritional Status: BMI 25 -29 Overweight Nutritional Risks: None Diabetes: No  How often do you need to have someone help you when you read instructions, pamphlets, or other written materials from your doctor or pharmacy?: 1 - Never  Interpreter Needed?: No  Information entered by :: Theresa Mulligan LPN   Activities of Daily Living    07/28/2023    9:37 AM  In your present state of health, do you have any difficulty performing the following activities:  Hearing? 1  Comment Hearing impairment without use of Hearing Aids.Declined referral  Vision? 0  Difficulty concentrating or making decisions? 0  Walking or climbing stairs? 0  Dressing or bathing? 0  Doing errands, shopping? 0  Preparing Food and eating ? N  Using the  Toilet? N  In the past six months, have you accidently leaked urine? N  Do you have problems with loss of bowel control? N  Managing your Medications? N  Managing your Finances? N  Housekeeping or managing your Housekeeping? N    Patient Care Team: Copland, Gwenlyn Found, MD as PCP - General (Family Medicine)  Indicate any recent Medical Services you may have received from other than [facility] providers in the past year (date may be approximate).     Assessment:   This is a routine wellness examination for Kylo.  Hearing/Vision screen Hearing Screening - Comments:: Hearing impairment without use of hearing aids. Declined referral Vision Screening - Comments:: Wears rx glasses - up to date with routine eye  exams with  [facility]   Goals Addressed               This Visit's Progress     Increase physical activity (pt-stated)        Continue to stay active.       Depression Screen    07/28/2023    9:34 AM 07/26/2022    9:06 AM 05/11/2022    2:54 PM 09/21/2021   10:02 AM 07/23/2021    3:13 PM 01/21/2021    1:41 PM 07/10/2020   10:42 AM  PHQ 2/9 Scores  PHQ - 2 Score 0 0 0 0 0 0 0    Fall Risk    07/28/2023    9:39 AM 02/23/2023   10:45 AM 07/26/2022    9:05 AM 05/11/2022    2:54 PM 09/21/2021   10:02 AM  Fall Risk   Falls in the past year? 0 1 0 0 0  Number falls in past yr: 0 0 0 0 0  Injury with Fall? 0 1 0 0 0  Risk for fall due to : No Fall Risks History of fall(s) No Fall Risks No Fall Risks   Follow up Falls prevention discussed Falls evaluation completed Falls evaluation completed Falls evaluation completed     MEDICARE RISK AT HOME: Medicare Risk at Home Any stairs in or around the home?: Yes If so, are there any without handrails?: No Home free of loose throw rugs in walkways, pet beds, electrical cords, etc?: Yes Adequate lighting in your home to reduce risk of falls?: Yes Life alert?: No Use of a cane, walker or w/c?: No Grab bars in the bathroom?:  No Shower chair or bench in shower?: No Elevated toilet seat or a handicapped toilet?: No  TIMED UP AND GO:  Was the test performed?  No    Cognitive Function:        07/28/2023    9:40 AM 07/26/2022    9:11 AM  6CIT Screen  What Year? 0 points 0 points  What month? 0 points 0 points  What time? 0 points 0 points  Count back from 20 0 points 0 points  Months in reverse 0 points 0 points  Repeat phrase 6 points 6 points  Total Score 6 points 6 points    Immunizations Immunization History  Administered Date(s) Administered   Fluad Quad(high Dose 65+) 07/08/2022   Fluad Trivalent(High Dose 65+) 07/20/2023   PFIZER(Purple Top)SARS-COV-2 Vaccination 10/01/2019, 11/08/2019, 06/23/2020   PNEUMOCOCCAL CONJUGATE-20 09/21/2021   Tdap 02/19/2015    TDAP status: Up to date  Flu Vaccine status: Up to date  Pneumococcal vaccine status: Up to date  Covid-19 vaccine status: Declined, Education has been provided regarding the importance of this vaccine but patient still declined. Advised may receive this vaccine at local pharmacy or Health Dept.or vaccine clinic. Aware to provide a copy of the vaccination record if obtained from local pharmacy or Health Dept. Verbalized acceptance and understanding.  Qualifies for Shingles Vaccine? Yes   Zostavax completed No   Shingrix Completed?: No.    Education has been provided regarding the importance of this vaccine. Patient has been advised to call insurance company to determine out of pocket expense if they have not yet received this vaccine. Advised may also receive vaccine at local pharmacy or Health Dept. Verbalized acceptance and understanding.  Screening Tests Health Maintenance  Topic Date Due   Zoster Vaccines- Shingrix (1 of 2) Never done   Colonoscopy  11/08/2020   COVID-19 Vaccine (4 - 2024-25 season) 03/13/2023   Medicare Annual Wellness (AWV)  07/27/2024   DTaP/Tdap/Td (2 - Td or Tdap) 02/18/2025   Pneumonia Vaccine 65+ Years  old  Completed   INFLUENZA VACCINE  Completed   Hepatitis C Screening  Completed   HPV VACCINES  Aged Out    Health Maintenance  Health Maintenance Due  Topic Date Due   Zoster Vaccines- Shingrix (1 of 2) Never done   Colonoscopy  11/08/2020   COVID-19 Vaccine (4 - 2024-25 season) 03/13/2023    Colorectal cancer screening: Referral to GI placed Deferred. Pt aware the office will call re: appt.     Additional Screening:  Hepatitis C Screening: does qualify; Completed 08/29/17  Vision Screening: Recommended annual ophthalmology exams for early detection of glaucoma and other disorders of the eye. Is the patient up to date with their annual eye exam?  Yes  Who is the provider or what is the name of the office in which the patient attends annual eye exams? [facility] If pt is not established with a provider, would they like to be referred to a provider to establish care? No .   Dental Screening: Recommended annual dental exams for proper oral hygiene   Community Resource Referral / Chronic Care Management:  CRR required this visit?  No   CCM required this visit?  No     Plan:     I have personally reviewed and noted the following in the patient's chart:   Medical and social history Use of alcohol, tobacco or illicit drugs  Current medications and supplements including opioid prescriptions. Patient is not currently taking opioid prescriptions. Functional ability and status Nutritional status Physical activity Advanced directives List of other physicians Hospitalizations, surgeries, and ER visits in previous 12 months Vitals Screenings to include cognitive, depression, and falls Referrals and appointments  In addition, I have reviewed and discussed with patient certain preventive protocols, quality metrics, and best practice recommendations. A written personalized care plan for preventive services as well as general preventive health recommendations were  provided to patient.     Tillie Rung, LPN   10/12/4740   After Visit Summary: (MyChart) Due to this being a telephonic visit, the after visit summary with patients personalized plan was offered to patient via MyChart   Nurse Notes: None

## 2023-08-11 DIAGNOSIS — R7303 Prediabetes: Secondary | ICD-10-CM | POA: Diagnosis not present

## 2023-08-11 DIAGNOSIS — I129 Hypertensive chronic kidney disease with stage 1 through stage 4 chronic kidney disease, or unspecified chronic kidney disease: Secondary | ICD-10-CM | POA: Diagnosis not present

## 2023-08-11 DIAGNOSIS — N200 Calculus of kidney: Secondary | ICD-10-CM | POA: Diagnosis not present

## 2023-08-11 DIAGNOSIS — N1831 Chronic kidney disease, stage 3a: Secondary | ICD-10-CM | POA: Diagnosis not present

## 2023-08-11 DIAGNOSIS — G43909 Migraine, unspecified, not intractable, without status migrainosus: Secondary | ICD-10-CM | POA: Diagnosis not present

## 2023-08-12 LAB — LAB REPORT - SCANNED
Albumin, Urine POC: 3
Creatinine, POC: 46.7 mg/dL
EGFR: 63
Microalb Creat Ratio: 6

## 2023-08-15 ENCOUNTER — Encounter: Payer: Self-pay | Admitting: Family Medicine

## 2023-08-15 DIAGNOSIS — M25552 Pain in left hip: Secondary | ICD-10-CM

## 2023-08-17 DIAGNOSIS — N200 Calculus of kidney: Secondary | ICD-10-CM | POA: Diagnosis not present

## 2023-08-19 DIAGNOSIS — K5730 Diverticulosis of large intestine without perforation or abscess without bleeding: Secondary | ICD-10-CM | POA: Diagnosis not present

## 2023-08-19 DIAGNOSIS — N201 Calculus of ureter: Secondary | ICD-10-CM | POA: Diagnosis not present

## 2023-08-19 DIAGNOSIS — N200 Calculus of kidney: Secondary | ICD-10-CM | POA: Diagnosis not present

## 2023-08-19 DIAGNOSIS — N281 Cyst of kidney, acquired: Secondary | ICD-10-CM | POA: Diagnosis not present

## 2023-08-19 DIAGNOSIS — K4090 Unilateral inguinal hernia, without obstruction or gangrene, not specified as recurrent: Secondary | ICD-10-CM | POA: Diagnosis not present

## 2023-09-01 DIAGNOSIS — M25552 Pain in left hip: Secondary | ICD-10-CM | POA: Diagnosis not present

## 2023-09-03 ENCOUNTER — Other Ambulatory Visit: Payer: Self-pay | Admitting: Family Medicine

## 2023-09-03 DIAGNOSIS — M5010 Cervical disc disorder with radiculopathy, unspecified cervical region: Secondary | ICD-10-CM

## 2023-09-03 DIAGNOSIS — R1084 Generalized abdominal pain: Secondary | ICD-10-CM

## 2023-09-23 DIAGNOSIS — N202 Calculus of kidney with calculus of ureter: Secondary | ICD-10-CM | POA: Diagnosis not present

## 2023-10-12 DIAGNOSIS — N201 Calculus of ureter: Secondary | ICD-10-CM | POA: Diagnosis not present

## 2023-10-12 DIAGNOSIS — N200 Calculus of kidney: Secondary | ICD-10-CM | POA: Diagnosis not present

## 2023-10-19 DIAGNOSIS — N202 Calculus of kidney with calculus of ureter: Secondary | ICD-10-CM | POA: Diagnosis not present

## 2023-10-19 DIAGNOSIS — N201 Calculus of ureter: Secondary | ICD-10-CM | POA: Diagnosis not present

## 2023-10-19 DIAGNOSIS — N200 Calculus of kidney: Secondary | ICD-10-CM | POA: Diagnosis not present

## 2023-10-26 DIAGNOSIS — I10 Essential (primary) hypertension: Secondary | ICD-10-CM | POA: Diagnosis not present

## 2023-10-26 DIAGNOSIS — K219 Gastro-esophageal reflux disease without esophagitis: Secondary | ICD-10-CM | POA: Diagnosis not present

## 2023-10-26 DIAGNOSIS — E785 Hyperlipidemia, unspecified: Secondary | ICD-10-CM | POA: Diagnosis not present

## 2023-10-26 DIAGNOSIS — N201 Calculus of ureter: Secondary | ICD-10-CM | POA: Diagnosis not present

## 2023-10-26 DIAGNOSIS — Z79899 Other long term (current) drug therapy: Secondary | ICD-10-CM | POA: Diagnosis not present

## 2024-01-04 DIAGNOSIS — N200 Calculus of kidney: Secondary | ICD-10-CM | POA: Diagnosis not present

## 2024-02-16 ENCOUNTER — Encounter: Payer: Self-pay | Admitting: *Deleted

## 2024-02-16 ENCOUNTER — Other Ambulatory Visit: Payer: Self-pay | Admitting: Family Medicine

## 2024-02-16 DIAGNOSIS — Z8639 Personal history of other endocrine, nutritional and metabolic disease: Secondary | ICD-10-CM

## 2024-02-17 ENCOUNTER — Ambulatory Visit: Admitting: Neurology

## 2024-02-17 ENCOUNTER — Encounter: Payer: Self-pay | Admitting: Neurology

## 2024-02-17 VITALS — BP 136/74 | HR 65 | Resp 17 | Ht 66.5 in | Wt 159.6 lb

## 2024-02-17 DIAGNOSIS — G8929 Other chronic pain: Secondary | ICD-10-CM

## 2024-02-17 DIAGNOSIS — M542 Cervicalgia: Secondary | ICD-10-CM

## 2024-02-17 DIAGNOSIS — G43109 Migraine with aura, not intractable, without status migrainosus: Secondary | ICD-10-CM | POA: Diagnosis not present

## 2024-02-17 DIAGNOSIS — M79601 Pain in right arm: Secondary | ICD-10-CM | POA: Diagnosis not present

## 2024-02-17 DIAGNOSIS — M5412 Radiculopathy, cervical region: Secondary | ICD-10-CM | POA: Diagnosis not present

## 2024-02-17 DIAGNOSIS — M79602 Pain in left arm: Secondary | ICD-10-CM

## 2024-02-17 DIAGNOSIS — R29898 Other symptoms and signs involving the musculoskeletal system: Secondary | ICD-10-CM | POA: Diagnosis not present

## 2024-02-17 MED ORDER — NURTEC 75 MG PO TBDP: 75.0000 mg | ORAL_TABLET | Freq: Every day | ORAL | 0 refills | Status: AC | PRN

## 2024-02-17 NOTE — Progress Notes (Addendum)
 [facility]    Provider:  Dr Ines Requesting Provider: Gearline Norris, MD Primary Care Provider:  Watt Harlene BROCKS, MD  CC:  migraines; pt reported having 3 migraine days per month and 3 migraine days and reported occasionally aura (jagged, multicolor light). Triggers: lights, weather changes , chronic neck pain  Addendum 03/05/2024: Please refer to orthopaedics within  for evaluation of epidural steroid injections or other procedures for chronic neck pain, foraminal stenosis, cervical radiculopathy  02/26/2024; MRI cervical spine   IMPRESSION: This MRI of the cervical spine without contrast shows the following: The spinal cord appears normal. At C2-C3, there are degenerative changes causing right foraminal narrowing but no spinal stenosis or nerve root compression.   At C3-C4, there are degenerative changes causing moderately severe right greater than left foraminal narrowing that could affect the C4 nerve roots. At C4-C5, there are degenerative changes more to the left causing moderately severe left foraminal narrowing.  There is no spinal stenosis but this could affect the left C5 nerve root. At C5-C6, there is mild spinal stenosis and moderate foraminal narrowing but no nerve root compression. At C6-C7, there is a right paramedian disc protrusion causing mild spinal stenosis but no nerve root compression. At C7 -T1, there are degenerative changes causing moderate foraminal narrowing but no spinal stenosis or nerve root compression.   HPI:  Paul Carlson is a 78 y.o. male here as requested by Gearline Norris, MD for migraines. has History of hyperlipidemia; Essential hypertension; Migraine with aura and without status migrainosus, not intractable; Generalized abdominal pain; Bilateral arm numbness and tingling while sleeping; Bilateral nephrolithiasis; Hypoxia; Prediabetes; and Chronic neck pain on their problem list.  He was referred from [facility] Dr. Gearline, I reviewed her notes from August 11, 2023 he has had kidney stones since the age of 63, past medical history of hypertension, GERD, prediabetes, bilateral nephrolithiasis, hyperlipidemia, CKD stage IIIa.  Last A1c was 6.1 which is prediabetic.  Labs collected August 11, 2023.  Patient was diagnosed with migraines with aura, appear to be complex, seen at [facility] previously but no follow-up in years, they started him on nortriptyline 10 mg daily.  I searched epic and Care Everywhere through 2011 I did not see a neurology note to review.  Also reviewed notes from orthopedics at [facility] Dr. Wallene: Diagnosed with cervical radiculopathy.  Intermittent right upper extremity weakness and numbness, bilateral cervical radiculopathy, he was seen for PT for this and MRI of the cervical spine, tingling in the arms, low-dose gabapentin  due to discomfort at night with pain.  Also followed by pain management in the past.  Patient here alone. Reports migraines for many years. Started having migraines as a teenager. Didn't get diagnosed until 25 years ago. He had migraines that mimicked a stroke and he went to [facility] and was diagnosed with migraine with aura, complicated migraine. He takes excedrin migraine at the onset of an aura. The aura can expand and affect his vision. He sometimes has an aura, multicolored jagged lines Triggers include lights, weather changes.  He was placed on nortriptyline which he states helps. Prior to starting he had 3 migraines that week but possibly due to weather; weather is a big trigger as is bright lights. Starts with the aura and can the aura without a headache. He has also had weakness left side of the face, arm was numb, couldn't remember the facts. Can be throbbing, light and sound sensitivity, nausea, hurts to  move, migraines in the past have been moderate to severe and can last 8-24 hours or more.Worst was the complicated migraine as above  which was 25 years ago. They are significantly affecting quality of life with work, family. He has >15 total headache days a month and > 8 total migrane days a month for the last > 3 months, no medication overuse, no aura. He has neck pain. He has tried to come off of gabapentin  but his arms start hurting and he can;t sleep due to the pain. No other focal neurologic deficits, associated symptoms, inciting events or modifiable factors.   Reviewed notes, labs and imaging from outside physicians, which showed:   Reviewed labs: 01/04/2024 bmp with creatinine 1.45 abnormal, cbc 10/12/2023 unremarkable. 02/23/2023 hgbac 6.1 pre-diabetic  From a thorough review of records and patient report, Medications tried that can be used in migraine/headache management greater than 3 months include: Lifestyle modification, headache diaries, better sleep hygiene, exercise, management of migraine triggers, OTC and prescribed analgesics/nsaids such as ibuprofen, excedrin, alleve and others, gabapentin , nortriptyline, lisinopril , Mobic , Zofran , propranolol, triptans contraindicated due to age and complex migraine,nortriptyline, topiramate  MRI cervical spine 2015: 1. Multilevel, multifactorial degenerative changes of the spine, not substantially changed since 03-23-2010. Changes are most pronounced at C3-C4 and C5-C6 where there is moderate bilateral foraminal foraminal stenosis and mild central spinal stenosis, as above. 2. Similar diffuse heterogeneous marrow signal is nonspecific, but can be seen in individuals with chronic anemia, chronic hypoxemia (such as smoking or severe chronic sleep apnea) versus other marrow infiltrative/myeloproliferative disorders. . Narrative  MR CERVICAL SPINE WITHOUT INFUSION, Apr 03, 2014 09:03:00 AM . INDICATION:   RADICULOPATHY, CERVICAL cervical stenosis, RUE radicular symptoms723.4 Cervical   COMPARISON: 12/31/2009 . TECHNIQUE: Multiplanar, multi-sequence surface-coil MR imaging of the  cervical spine was performed without contrast. . LEVELS IMAGED: Foramen magnum to upper thoracic region . FINDINGS: .  Alignment: Diffuse heterogeneous marrow signal is nonspecific, but can be seen in individuals with chronic anemia, chronic hypoxemia (such as smoking or severe chronic sleep apnea) versus other marrow infiltrative/myeloproliferative disorders. .  Vertebrae: No marrow signal abnormalities to suggest neoplasm. SABRA  Spinal cord: Normal signal and contour.   .  Cervicocranial junction:  Degenerative effusions at the craniocervical junction. .  C1-C2: No significant focal abnormality. .  C2-C3: Uncovertebral joint hypertrophy, with mild right foraminal stenosis. .  C3-C4: Posterior disc osteophyte complex which effaces the ventral thecal sac. Uncovertebral joint hypertrophy and facet arthropathy, with moderate bilateral foraminal stenosis. SABRA  C4-C5: Tiny posterior disc herniation, without substantial central stenosis. Uncovertebral joint hypertrophy, with mild left foraminal stenosis. SABRA  C5-C6: Posterior disc osteophyte complex and ligamentous thickening with mild central stenosis. Uncovertebral joint hypertrophy resulting in moderate foraminal stenosis. SABRA  C6-C7: Small disc herniation which effaces the ventral CSF. Posterior disc osteophyte complex with uncovertebral joint hypertrophy, resulting in mild left foraminal stenosis. .  C7-T1: Posterior disc osteophyte complex and uncovertebral joint hypertrophy, with minimal foraminal stenosis .  Upper Thoracic Spine:  No significant focal abnormality.  Review of Systems: Patient complains of symptoms per HPI as well as the following symptoms none. Pertinent negatives and positives per HPI. All others negative.   Social History   Socioeconomic History   Marital status: Married    Spouse name: susie   Number of children: 3   Years of education: Not on file   Highest education level: Bachelor's degree (e.g., BA, AB, BS)   Occupational History   Occupation: Education officer, environmental  Tobacco  Use   Smoking status: Never    Passive exposure: Never   Smokeless tobacco: Never  Vaping Use   Vaping status: Never Used  Substance and Sexual Activity   Alcohol use: No   Drug use: Not Currently   Sexual activity: Yes    Birth control/protection: None  Other Topics Concern   Not on file  Social History Narrative   Not on file   Social Drivers of Health   Financial Resource Strain: Low Risk  (07/28/2023)   Overall Financial Resource Strain (CARDIA)    Difficulty of Paying Living Expenses: Not hard at all  Food Insecurity: Low Risk  (10/12/2023)   Received from [facility]   Hunger Vital Sign    Within the past 12 months, you worried that your food would run out before you got money to buy more: Never true    Within the past 12 months, the food you bought just didn't last and you didn't have money to get more. : Never true  Transportation Needs: No Transportation Needs (10/12/2023)   Received from [facility]    In the past 12 months, has lack of reliable transportation kept you from medical appointments, meetings, work or from getting things needed for daily living? : No  Physical Activity: Inactive (07/28/2023)   Exercise Vital Sign    Days of Exercise per Week: 0 days    Minutes of Exercise per Session: 0 min  Stress: No Stress Concern Present (07/28/2023)   [employer] of Occupational Health - Occupational Stress Questionnaire    Feeling of Stress : Not at all  Social Connections: Socially Integrated (07/28/2023)   Social Connection and Isolation Panel    Frequency of Communication with Friends and Family: More than three times a week    Frequency of Social Gatherings with Friends and Family: More than three times a week    Attends Religious Services: More than 4 times per year    Active Member of [employer] or Organizations: Yes    Attends Banker Meetings: More than 4 times per year     Marital Status: Married  Catering manager Violence: Not At Risk (07/28/2023)   Humiliation, Afraid, Rape, and Kick questionnaire    Fear of Current or Ex-Partner: No    Emotionally Abused: No    Physically Abused: No    Sexually Abused: No    Family History  Problem Relation Age of Onset   Hypertension Mother    Alzheimer's disease Father    Hypertension Father    Kidney disease Father    Arthritis Father    Cancer Paternal Grandfather        Prostate Cancer   Alcohol abuse Neg Hx     Past Medical History:  Diagnosis Date   Bilateral nephrolithiasis    Borderline hypertension    Cervical radiculopathy    CKD stage 3a, GFR 45-59 ml/min (HCC)    Frequent headaches    GERD (gastroesophageal reflux disease)    Hyperlipidemia    Hypertension    Migraines    Prediabetes     Patient Active Problem List   Diagnosis Date Noted   Chronic neck pain 02/17/2024   Prediabetes 02/23/2023   Bilateral nephrolithiasis 02/24/2021   Generalized abdominal pain 04/29/2020   Hypoxia 10/17/2018   Bilateral arm numbness and tingling while sleeping 07/20/2017   History of hyperlipidemia 02/23/2016   Essential hypertension 02/23/2016   Migraine with aura and without status migrainosus, not intractable  02/19/2015    Past Surgical History:  Procedure Laterality Date   HERNIA REPAIR     LITHOTRIPSY      Current Outpatient Medications  Medication Sig Dispense Refill   acetaminophen  (TYLENOL ) 650 MG CR tablet Take 650 mg by mouth at bedtime as needed for pain.     aspirin-acetaminophen -caffeine (EXCEDRIN MIGRAINE) 250-250-65 MG tablet Take 1-2 tablets by mouth as needed     gabapentin  (NEURONTIN ) 400 MG capsule TAKE 3 CAPSULES (1,200 MG TOTAL) BY MOUTH AT BEDTIME. 270 capsule 1   ibuprofen (ADVIL,MOTRIN) 200 MG tablet Take 600 mg by mouth as needed.     lisinopril  (ZESTRIL ) 5 MG tablet Take 1 tablet (5 mg total) by mouth daily. 90 tablet 3   pantoprazole  (PROTONIX ) 40 MG tablet TAKE 1  TABLET BY MOUTH EVERY DAY 90 tablet 1   Rimegepant Sulfate (NURTEC) 75 MG TBDP Take 1 tablet (75 mg total) by mouth daily as needed. For migraines. Take as close to onset of migraine as possible. One daily maximum. 12 tablet 0   rosuvastatin  (CRESTOR ) 10 MG tablet TAKE 1 TABLET BY MOUTH EVERY DAY 90 tablet 3   tamsulosin (FLOMAX) 0.4 MG CAPS capsule Take 0.4 mg by mouth daily.     No current facility-administered medications for this visit.    Allergies as of 02/17/2024 - Review Complete 02/17/2024  Allergen Reaction Noted   Penicillins Rash and Dermatitis 02/20/2016   Azithromycin Other (See Comments) 02/20/2016   Pollen extract Other (See Comments) 02/20/2016    Vitals: BP 136/74   Pulse 65   Resp 17   Ht 5' 6.5 (1.689 m)   Wt 159 lb 9.6 oz (72.4 kg)   SpO2 96%   BMI 25.37 kg/m  Last Weight:  Wt Readings from Last 1 Encounters:  02/17/24 159 lb 9.6 oz (72.4 kg)   Last Height:   Ht Readings from Last 1 Encounters:  02/17/24 5' 6.5 (1.689 m)     Physical exam: Exam: Gen: NAD, conversant, well nourised, well groomed                     CV: RRR, no MRG. No Carotid Bruits. No peripheral edema, warm, nontender Eyes: Conjunctivae clear without exudates or hemorrhage  Neuro: Detailed Neurologic Exam  Speech:    Speech is normal; fluent and spontaneous with normal comprehension.  Cognition:    The patient is oriented to person, place, and time;     recent and remote memory intact;     language fluent;     normal attention, concentration,     fund of knowledge Cranial Nerves:    The pupils are equal, round, and reactive to light. Could not visualize fundi due to photophobia . Visual fields are full to finger confrontation. Extraocular movements are intact. Trigeminal sensation is intact and the muscles of mastication are normal. The face is symmetric. The palate elevates in the midline. Hearing intact. Voice is normal. Shoulder shrug is normal. The tongue has normal  motion without fasciculations.   Coordination: normal  Gait: normal  Motor Observation:    No asymmetry, no atrophy, and no involuntary movements noted. Tone:    Normal muscle tone.    Posture:    Posture is normal. normal erect    Strength: mild prox weakness upper extremities otherwise strength is V/V in the upper and lower limbs.      Sensation: intact to LT     Reflex Exam:  DTR's:  Deep tendon reflexes in the upper and lower extremities are symmetrical bilaterally.   Toes:    The toes are downgoing bilaterally.   Clonus:    Clonus is absent.    Assessment/Plan:  Patient with episodic migraines. 4 migraines a month and < 6 total headache days a month. Triptans contraindicated due to complicated migraines and age.   Addendum 03/05/2024: Please refer to orthopaedics within [facility] for evaluation of epidural steroid injections or other procedures for chronic neck pain, foraminal stenosis, cervical radiculopathy  02/26/2024; MRI cervical spine   IMPRESSION: This MRI of the cervical spine without contrast shows the following: The spinal cord appears normal. At C2-C3, there are degenerative changes causing right foraminal narrowing but no spinal stenosis or nerve root compression.   At C3-C4, there are degenerative changes causing moderately severe right greater than left foraminal narrowing that could affect the C4 nerve roots. At C4-C5, there are degenerative changes more to the left causing moderately severe left foraminal narrowing.  There is no spinal stenosis but this could affect the left C5 nerve root. At C5-C6, there is mild spinal stenosis and moderate foraminal narrowing but no nerve root compression. At C6-C7, there is a right paramedian disc protrusion causing mild spinal stenosis but no nerve root compression. At C7 -T1, there are degenerative changes causing moderate foraminal narrowing but no spinal stenosis or nerve root compression.  Chronic neck,  shoulder and arm pain radicular,Gabapentin  long term, wants to stop due to side effects but has chronic neck pain, has been to ortho and neurosurgery, cervical radiculopathy, has been managed for years by pain management. Repeat MRI cervical spine and send for possible epidural steroid injections, medial branch blocks or other management that may be able to get patient off or on a lower dose of gabapentin . [facility] to [facility]. Had PT in the past for this and MRI of the cervical spine with degenerative changes. Nortriptyline helps but has side effects of sluggish feeling.  Try Nurtec prn. Discussed preventatives. Start Nurtec as need right at the onset of migraine or aura, can also take it in advance if you suspect a migraine that day or the next few days can take it daily. Some people take it every other day as prevention. Will mychart us  ff he would like a prescription.  If migraines worsen or wants prevention, can schedule a follow up by video If you wanted to start a preventative I would recommend new CGRP class such as Ajovy or Emgality Discussed: Non pharmaceutical treatments for migraines: Discussed magnesium 0 and migraine aura thought to help with cortical spreading depression and has efficacy in migraine with aura can try but would definitely talk to kidney doctor first given his chronic kidney disease, talk to your kidney doctor about any of the supplements below especially magnesium.  His kidney function is adequate to take Nurtec, as needed.  Would talk to your kidney doctor Magnesium doses and other OTC medications you can try: 200 mg mag citrate 1-2 daily 400 mg mag oxide 1-2 daily Mag sulfate 600 mg daily Mag Glycinate 200-400mg  daily  Other vitamin supplementation that may work includes  1000 international units vitamin D3 daily 500 to 1000 mg of B12 daily Riboflavin 400 mg a day 150 to 300 mg co-Q10 daily  Orders Placed This Encounter  Procedures   MR CERVICAL  SPINE WO CONTRAST   Meds ordered this encounter  Medications   Rimegepant Sulfate (NURTEC) 75 MG TBDP  Sig: Take 1 tablet (75 mg total) by mouth daily as needed. For migraines. Take as close to onset of migraine as possible. One daily maximum.    Dispense:  12 tablet    Refill:  0    Cc: Gearline Norris, MD,  [place], Harlene BROCKS, MD  Onetha Epp, MD  [facility] [address] [place], [place] [address]  Phone [phone] Fax [phone]  I spent 61 minutes of face-to-face and non-face-to-face time with patient on the  1. Migraine with aura and without status migrainosus, not intractable   2. Chronic neck pain   3. Cervical radiculopathy   4. Bilateral arm weakness   5. Pain in both upper extremities    diagnosis.  This included previsit chart review, lab review, study review, order entry, electronic health record documentation, patient education on the different diagnostic and therapeutic options, counseling and coordination of care, risks and benefits of management, compliance, or risk factor reduction

## 2024-02-17 NOTE — Patient Instructions (Addendum)
 MRI of the cervical spine to see if there are any interventions either injections or otherwise to help and get you off of Gabapentin  Stop Nortriptyline Start Nurtec as need right at the onset of migraine or aura, can also take it in advance if you suspect a migraine that day or the next few days can take it daily. Some people take it every other day as prevention. Once daily as needed.  If you wanted to start a preventative I would recommend new CGRP class such as Ajovy or Emgality  Non pharmaceutical treatments for migraines  Would talk to your kidney doctor Magnesium doses and other OTC medications you can try: 200 mg mag citrate 1-2 daily 400 mg mag oxide 1-2 daily Mag sulfate 600 mg daily Mag Glycinate 200-400mg  daily  Other vitamin supplementation that may work includes  1000 international units vitamin D3 daily 500 to 1000 mg of B12 daily Riboflavin 400 mg a day 150 to 300 mg co-Q10 daily  NON pharmaceutical options:  Cefaly    Nerivio     Fascial release tools    Rimegepant Disintegrating Tablets What is this medication? RIMEGEPANT (ri ME je pant) prevents and treats migraines. It works by blocking a substance in the body that causes migraines. This medicine may be used for other purposes; ask your health care provider or pharmacist if you have questions. COMMON BRAND NAME(S): NURTEC ODT What should I tell my care team before I take this medication? They need to know if you have any of these conditions: Kidney disease Liver disease An unusual or allergic reaction to rimegepant, other medications, foods, dyes, or preservatives Pregnant or trying to get pregnant Breast-feeding How should I use this medication? Take this medication by mouth. Take it as directed on the prescription label. Leave the tablet in the sealed pack until you are ready to take it. With dry hands, open the pack and gently remove the tablet. If the tablet breaks or crumbles, throw it away. Use  a new tablet. Place the tablet in the mouth and allow it to dissolve. Then, swallow it. Do not cut, crush, or chew this medication. You do not need water to take this medication. Talk to your care team about the use of this medication in children. Special care may be needed. Overdosage: If you think you have taken too much of this medicine contact a poison control center or emergency room at once. NOTE: This medicine is only for you. Do not share this medicine with others. What if I miss a dose? This does not apply. This medication is not for regular use. What may interact with this medication? Certain medications for fungal infections, such as fluconazole, itraconazole Rifampin This list may not describe all possible interactions. Give your health care provider a list of all the medicines, herbs, non-prescription drugs, or dietary supplements you use. Also tell them if you smoke, drink alcohol, or use illegal drugs. Some items may interact with your medicine. What should I watch for while using this medication? Visit your care team for regular checks on your progress. Tell your care team if your symptoms do not start to get better or if they get worse. What side effects may I notice from receiving this medication? Side effects that you should report to your care team as soon as possible: Allergic reactions--skin rash, itching, hives, swelling of the face, lips, tongue, or throat Side effects that usually do not require medical attention (report to your care team if they continue  or are bothersome): Nausea Stomach pain This list may not describe all possible side effects. Call your doctor for medical advice about side effects. You may report side effects to FDA at 1-800-FDA-1088. Where should I keep my medication? Keep out of the reach of children and pets. Store at room temperature between 20 and 25 degrees C (68 and 77 degrees F). Get rid of any unused medication after the expiration date. To  get rid of medications that are no longer needed or have expired: Take the medication to a medication take-back program. Check with your pharmacy or law enforcement to find a location. If you cannot return the medication, check the label or package insert to see if the medication should be thrown out in the garbage or flushed down the toilet. If you are not sure, ask your care team. If it is safe to put it in the trash, take the medication out of the container. Mix the medication with cat litter, dirt, coffee grounds, or other unwanted substance. Seal the mixture in a bag or container. Put it in the trash. NOTE: This sheet is a summary. It may not cover all possible information. If you have questions about this medicine, talk to your doctor, pharmacist, or health care provider.  2024 Elsevier/Gold Standard (2021-08-19 00:00:00)

## 2024-02-26 ENCOUNTER — Ambulatory Visit
Admission: RE | Admit: 2024-02-26 | Discharge: 2024-02-26 | Disposition: A | Discharge status: Home / Self Care | Source: Ambulatory Visit | Attending: Neurology | Admitting: Neurology

## 2024-02-26 DIAGNOSIS — R29898 Other symptoms and signs involving the musculoskeletal system: Secondary | ICD-10-CM

## 2024-02-26 DIAGNOSIS — M5412 Radiculopathy, cervical region: Secondary | ICD-10-CM | POA: Diagnosis not present

## 2024-02-26 DIAGNOSIS — M542 Cervicalgia: Secondary | ICD-10-CM | POA: Diagnosis not present

## 2024-02-26 DIAGNOSIS — G8929 Other chronic pain: Secondary | ICD-10-CM

## 2024-02-26 DIAGNOSIS — M79602 Pain in left arm: Secondary | ICD-10-CM

## 2024-03-01 ENCOUNTER — Ambulatory Visit: Payer: Self-pay | Admitting: Neurology

## 2024-03-02 ENCOUNTER — Other Ambulatory Visit: Payer: Self-pay | Admitting: Family Medicine

## 2024-03-02 DIAGNOSIS — R1084 Generalized abdominal pain: Secondary | ICD-10-CM

## 2024-03-02 DIAGNOSIS — M5010 Cervical disc disorder with radiculopathy, unspecified cervical region: Secondary | ICD-10-CM

## 2024-03-05 ENCOUNTER — Other Ambulatory Visit: Payer: Self-pay | Admitting: Neurology

## 2024-03-05 DIAGNOSIS — G8929 Other chronic pain: Secondary | ICD-10-CM

## 2024-03-05 DIAGNOSIS — M4722 Other spondylosis with radiculopathy, cervical region: Secondary | ICD-10-CM

## 2024-03-06 ENCOUNTER — Telehealth: Payer: Self-pay | Admitting: Neurology

## 2024-03-06 NOTE — Telephone Encounter (Signed)
 Referral for orthopedic sent through [facility] to [facility],  [facility]. Phone: [phone], Fax: [phone]

## 2024-03-21 DIAGNOSIS — I129 Hypertensive chronic kidney disease with stage 1 through stage 4 chronic kidney disease, or unspecified chronic kidney disease: Secondary | ICD-10-CM | POA: Diagnosis not present

## 2024-03-21 DIAGNOSIS — N1831 Chronic kidney disease, stage 3a: Secondary | ICD-10-CM | POA: Diagnosis not present

## 2024-03-21 DIAGNOSIS — N200 Calculus of kidney: Secondary | ICD-10-CM | POA: Diagnosis not present

## 2024-03-21 DIAGNOSIS — R7303 Prediabetes: Secondary | ICD-10-CM | POA: Diagnosis not present

## 2024-03-21 DIAGNOSIS — E785 Hyperlipidemia, unspecified: Secondary | ICD-10-CM | POA: Diagnosis not present

## 2024-03-21 DIAGNOSIS — G43909 Migraine, unspecified, not intractable, without status migrainosus: Secondary | ICD-10-CM | POA: Diagnosis not present

## 2024-03-25 ENCOUNTER — Other Ambulatory Visit: Payer: Self-pay | Admitting: Family Medicine

## 2024-03-25 DIAGNOSIS — R1084 Generalized abdominal pain: Secondary | ICD-10-CM

## 2024-03-30 ENCOUNTER — Other Ambulatory Visit: Payer: Self-pay | Admitting: Family Medicine

## 2024-03-30 DIAGNOSIS — M5010 Cervical disc disorder with radiculopathy, unspecified cervical region: Secondary | ICD-10-CM

## 2024-04-03 NOTE — Patient Instructions (Incomplete)
 It was great to see you today, I will be in touch with your labs Please stop by imaging on the ground floor to get your CT coronary set up Flu shot today Recommend one dose RSV, covid booster, shingles vaccine series  If all is well please see me in 6 months

## 2024-04-03 NOTE — Progress Notes (Unsigned)
 [facility] [address] [place], [place] [address] [phone]  Date:  04/04/2024   Name:  Paul Carlson   DOB:  [birth year]   MRN:  [id number]  PCP:  Watt Harlene BROCKS, MD    Chief Complaint: No chief complaint on file.   History of Present Illness:  Paul Carlson is a 78 y.o. very pleasant male patient who presents with the following:  Patient seen today for physical exam.  Most recent visit with me was in January History of migraine headache, hypertension, hyperlipidemia, prostatitis and nephrolithiasis, prediabetes   He has urology care for kidney stones-they also perform his PSA screening   - Colon cancer screening - Flu shot - Recommend COVID booster - Recommend Shingrix - Recommend RSV  Most recent labs completed in January  Gabapentin  1200 mg at bedtime Lisinopril  5 Pantoprazole  40 Nurtec as needed Crestor  10  Discussed the use of AI scribe software for clinical note transcription with the patient, who gave verbal consent to proceed.  History of Present Illness    Patient Active Problem List   Diagnosis Date Noted   Chronic neck pain 02/17/2024   Prediabetes 02/23/2023   Bilateral nephrolithiasis 02/24/2021   Generalized abdominal pain 04/29/2020   Hypoxia 10/17/2018   Bilateral arm numbness and tingling while sleeping 07/20/2017   History of hyperlipidemia 02/23/2016   Essential hypertension 02/23/2016   Migraine with aura and without status migrainosus, not intractable 02/19/2015    Past Medical History:  Diagnosis Date   Bilateral nephrolithiasis    Borderline hypertension    Cervical radiculopathy    CKD stage 3a, GFR 45-59 ml/min (HCC)    Frequent headaches    GERD (gastroesophageal reflux disease)    Hyperlipidemia    Hypertension    Migraines    Prediabetes     Past Surgical History:  Procedure Laterality Date   HERNIA REPAIR     LITHOTRIPSY      Social  History   Tobacco Use   Smoking status: Never    Passive exposure: Never   Smokeless tobacco: Never  Vaping Use   Vaping status: Never Used  Substance Use Topics   Alcohol use: No   Drug use: Not Currently    Family History  Problem Relation Age of Onset   Hypertension Mother    Alzheimer's disease Father    Hypertension Father    Kidney disease Father    Arthritis Father    Cancer Paternal Grandfather        Prostate Cancer   Alcohol abuse Neg Hx     Allergies  Allergen Reactions   Penicillins Rash and Dermatitis   Azithromycin Other (See Comments)    Pt. reported that it gave him severe stomach cramps.  Other Reaction(s): Not available   Pollen Extract Other (See Comments)    Pt. reports nasal congestion    Medication list has been reviewed and updated.  Current Outpatient Medications on File Prior to Visit  Medication Sig Dispense Refill   acetaminophen  (TYLENOL ) 650 MG CR tablet Take 650 mg by mouth at bedtime as needed for pain.     aspirin-acetaminophen -caffeine (EXCEDRIN MIGRAINE) 250-250-65 MG tablet Take 1-2 tablets by mouth as needed     gabapentin  (NEURONTIN ) 400 MG capsule Take 3 capsules (1,200 mg total) by mouth at bedtime. Needs appt 90 capsule 0   ibuprofen (ADVIL,MOTRIN) 200 MG tablet Take 600 mg by mouth as needed.  lisinopril  (ZESTRIL ) 5 MG tablet Take 1 tablet (5 mg total) by mouth daily. 90 tablet 3   pantoprazole  (PROTONIX ) 40 MG tablet Take 1 tablet (40 mg total) by mouth daily. 90 tablet 0   Rimegepant Sulfate (NURTEC) 75 MG TBDP Take 1 tablet (75 mg total) by mouth daily as needed. For migraines. Take as close to onset of migraine as possible. One daily maximum. 12 tablet 0   rosuvastatin  (CRESTOR ) 10 MG tablet TAKE 1 TABLET BY MOUTH EVERY DAY 90 tablet 3   tamsulosin (FLOMAX) 0.4 MG CAPS capsule Take 0.4 mg by mouth daily.     No current facility-administered medications on file prior to visit.    Review of Systems:  As per HPI-  otherwise negative.   Physical Examination: There were no vitals filed for this visit. There were no vitals filed for this visit. There is no height or weight on file to calculate BMI. Ideal Body Weight:    GEN: no acute distress. HEENT: Atraumatic, Normocephalic.  Ears and Nose: No external deformity. CV: RRR, No M/G/R. No JVD. No thrill. No extra heart sounds. PULM: CTA B, no wheezes, crackles, rhonchi. No retractions. No resp. distress. No accessory muscle use. ABD: S, NT, ND, +BS. No rebound. No HSM. EXTR: No c/c/e PSYCH: Normally interactive. Conversant.    Assessment and Plan: No diagnosis found.  Assessment & Plan   Signed Harlene Schroeder, MD

## 2024-04-04 ENCOUNTER — Encounter: Payer: Self-pay | Admitting: Family Medicine

## 2024-04-04 ENCOUNTER — Ambulatory Visit (HOSPITAL_BASED_OUTPATIENT_CLINIC_OR_DEPARTMENT_OTHER)
Admission: RE | Admit: 2024-04-04 | Discharge: 2024-04-04 | Disposition: A | Discharge status: Home / Self Care | Payer: Self-pay | Source: Ambulatory Visit | Attending: Family Medicine | Admitting: Family Medicine

## 2024-04-04 ENCOUNTER — Encounter: Payer: Self-pay | Admitting: Physical Medicine and Rehabilitation

## 2024-04-04 ENCOUNTER — Ambulatory Visit (INDEPENDENT_AMBULATORY_CARE_PROVIDER_SITE_OTHER): Admitting: Family Medicine

## 2024-04-04 ENCOUNTER — Ambulatory Visit: Admitting: Physical Medicine and Rehabilitation

## 2024-04-04 VITALS — BP 142/73 | HR 58

## 2024-04-04 VITALS — BP 108/62 | HR 61 | Temp 98.1°F | Ht 66.5 in | Wt 159.4 lb

## 2024-04-04 DIAGNOSIS — G43109 Migraine with aura, not intractable, without status migrainosus: Secondary | ICD-10-CM

## 2024-04-04 DIAGNOSIS — M25512 Pain in left shoulder: Secondary | ICD-10-CM

## 2024-04-04 DIAGNOSIS — Z23 Encounter for immunization: Secondary | ICD-10-CM | POA: Diagnosis not present

## 2024-04-04 DIAGNOSIS — G8929 Other chronic pain: Secondary | ICD-10-CM

## 2024-04-04 DIAGNOSIS — Z1211 Encounter for screening for malignant neoplasm of colon: Secondary | ICD-10-CM | POA: Diagnosis not present

## 2024-04-04 DIAGNOSIS — R202 Paresthesia of skin: Secondary | ICD-10-CM | POA: Diagnosis not present

## 2024-04-04 DIAGNOSIS — I10 Essential (primary) hypertension: Secondary | ICD-10-CM | POA: Diagnosis not present

## 2024-04-04 DIAGNOSIS — M5412 Radiculopathy, cervical region: Secondary | ICD-10-CM

## 2024-04-04 DIAGNOSIS — Z8639 Personal history of other endocrine, nutritional and metabolic disease: Secondary | ICD-10-CM | POA: Insufficient documentation

## 2024-04-04 DIAGNOSIS — R7303 Prediabetes: Secondary | ICD-10-CM | POA: Insufficient documentation

## 2024-04-04 DIAGNOSIS — Z125 Encounter for screening for malignant neoplasm of prostate: Secondary | ICD-10-CM | POA: Diagnosis not present

## 2024-04-04 DIAGNOSIS — M25511 Pain in right shoulder: Secondary | ICD-10-CM

## 2024-04-04 NOTE — Progress Notes (Unsigned)
 Pain Scale   Average Pain 1  Having aching not pain in both arms and shoulders with tingling. Causing discomfort at night when he sleeps. Had to stop ibuprofen and tylenol  and would like to stop gabapentin .   Had neck MRI on 02/26/2024.  He has been diagnosed with Stage 3 Renal Failure.

## 2024-04-04 NOTE — Progress Notes (Unsigned)
 Paul Carlson - 78 y.o. male MRN [id number]  Date of birth: 1946/04/22  Office Visit Note: Visit Date: 04/04/2024 PCP: Watt Harlene BROCKS, MD Referred by: Watt Harlene BROCKS, MD  Subjective: Chief Complaint  Patient presents with   Right Shoulder - Tingling, Pain   Left Shoulder - Pain, Tingling   Left Arm - Pain, Tingling   Right Arm - Pain, Tingling   HPI: Paul Carlson is a 78 y.o. male who comes in today per the request of Dr. Onetha Epp for evaluation of chronic, worsening and severe bilateral neck pain radiating to shoulders. Also reports numbness/tingling to arms and hands. Symptoms ongoing for 10 plus years. He reports intermittent issues gripping objects with right hand, chronic pain to ulnar distribution of right hand. States he does drop coffee cup occasionally if holding in right hand. He was previously managed by Dr. Norleen Capri with [facility]. These notes can be further reviewed in EPIC. His symptoms worsen with movement and activity. He describes symptoms as shocking, numb and sore sensation, currently rates as 5 out of 10. Some relief of pain with home exercise regimen, rest and use of medications. He is currently taking 1200 mg Gabapentin  at bedtime. History of formal physical therapy in the past with minimal relief of pain. Recent cervical MRI imaging shows multi level degenerative changes and foraminal narrowing. There is mild spinal canal stenosis at C5-C6. No history of cervical surgery/injections. He does reports chronic issues with bilateral shoulder pain. No history of upper extremity nerve study. Patient denies recent trauma or falls.      Review of Systems  Musculoskeletal:  Positive for myalgias and neck pain.  Neurological:  Positive for tingling. Negative for focal weakness and weakness.  All other systems reviewed and are negative.  Otherwise per HPI.  Assessment & Plan: Visit Diagnoses:    ICD-10-CM   1. Radiculopathy, cervical  region  M54.12     2. Chronic pain of both shoulders  M25.511    G89.29    M25.512     3. Paresthesia of skin  R20.2        Plan: Findings:  Chronic, worsening and severe bilateral neck pain radiating to shoulders. Also reports numbness/tingling to arms and hands.  Patient continues to have severe symptoms despite good conservative therapy such as formal physical therapy, home exercise regimen, rest and use of medications.  Patient's clinical presentation and exam are complex, his symptoms do seem most consistent with cervical radiculopathy.  Recent cervical MRI does show multilevel degenerative changes and foraminal stenosis.  Upon independent review there is central canal stenosis at the level of C5-C6.  There is no high-grade spinal canal stenosis, no clear explanation for his right hand pain/weak grip strength.  This could be more of an ulnar neuropathy.  We discussed treatment plan in detail today.  Next step is to perform diagnostic and hopefully therapeutic right C7-T1 interlaminar epidural steroid injection under fluoroscopic guidance.  He is not currently taking anticoagulant medication.  I discussed injection procedure in detail today, he has no questions at this time.  His exam today is nonfocal, good strength noted to bilateral upper extremities.  There is full range of motion noted to bilateral shoulders, no signs of impingement.    Meds & Orders: No orders of the defined types were placed in this encounter.  No orders of the defined types were placed in this encounter.   Follow-up: Return for Right C7-T1 interlaminar epidural steroid  injection.   Procedures: No procedures performed      Clinical History: NEUROIMAGING REPORT     STUDY DATE: 02/26/2024 PATIENT NAME: Paul Carlson DOB: [birth year] MRN: [id number]   EXAM: MRI of the cervical spine   ORDERING CLINICIAN: Onetha Epp, MD CLINICAL HISTORY: 78 year old man with neck pain, cervical radiculopathy and arm weakness  and pain COMPARISON FILMS: None   TECHNIQUE: MRI of the cervical spine was obtained utilizing 3 mm sagittal slices from the posterior fossa down to the T3-4 level with T1, T2 and inversion recovery views. In addition 4 mm axial slices from C2-3 down to T1-2 level were included with T2 and gradient echo views. CONTRAST: None IMAGING SITE: .315   FINDINGS: :  On sagittal images, the spine is imaged from above the cervicomedullary junction to T2.   The cervicomedullary junction appears normal.  The cerebellar vermian atrophy is noted.  The spinal cord is of normal caliber and signal.      The vertebral bodies are normally aligned.   There is a mildly mottled appearance of the bone marrow usually due to reconversion associated with smoking, obesity or aging.  Endplate degenerative changes are noted at C7-T1 with fatty replacement of the marrow.   The discs and interspaces were further evaluated on axial views from C2 to T1 as follows:   C2-C3: There is disc bulging, mild right uncovertebral spurring and mild right facet hypertrophy.  This causes mild to moderate right foraminal narrowing.  No spinal stenosis or nerve root compression.   C3-C4: There is disc bulging and bilateral disc osteophyte complexes.  This narrows the central canal but not enough to be considered spinal stenosis.  There is moderately severe right greater than left foraminal narrowing that could affect the C4 nerve roots.   C4-C5: There is a left disc osteophyte complex and left facet hypertrophy combining to cause moderately severe left foraminal narrowing.  There is no spinal stenosis but the degenerative changes could affect the left C5 nerve root.   C5-C6: There is broad disc bulging, uncovertebral spurring and ligamenta flava hypertrophy causing mild spinal stenosis and moderate right and mild to moderate left foraminal narrowing.  There does not appear to be nerve root compression.   C6-C7: There is a right paramedian disc  protrusion effacing the thecal surface of the thecal sac without causing spinal cord compression or signal changes.  There is mild right foraminal but no nerve root compression.   C7-T1: There are endplate degenerative changes, and small disc osteophyte complexes causing moderate bilateral foraminal narrowing but no spinal stenosis or nerve root compression.   T1-T2: This level is unremarkable.     IMPRESSION: This MRI of the cervical spine without contrast shows the following: 1. The spinal cord appears normal. 2. At C2-C3, there are degenerative changes causing right foraminal narrowing but no spinal stenosis or nerve root compression.   3. At C3-C4, there are degenerative changes causing moderately severe right greater than left foraminal narrowing that could affect the C4 nerve roots. 4. At C4-C5, there are degenerative changes more to the left causing moderately severe left foraminal narrowing.  There is no spinal stenosis but this could affect the left C5 nerve root. 5. At C5-C6, there is mild spinal stenosis and moderate foraminal narrowing but no nerve root compression. 6. At C6-C7, there is a right paramedian disc protrusion causing mild spinal stenosis but no nerve root compression. 7. At C7 -T1, there are degenerative changes causing moderate foraminal narrowing  but no spinal stenosis or nerve root compression.       INTERPRETING PHYSICIAN:  Richard A. Vear, MD, PhD, DIEDRA   He reports that he has never smoked. He has never been exposed to tobacco smoke. He has never used smokeless tobacco. No results for input(s): HGBA1C, LABURIC in the last 8760 hours.  Objective:  VS:  HT:    WT:   BMI:     BP:(!) 142/73  HR:(!) 58bpm  TEMP: ( )  RESP:  Physical Exam Vitals and nursing note reviewed.  HENT:     Head: Normocephalic and atraumatic.     Right Ear: External ear normal.     Left Ear: External ear normal.     Nose: Nose normal.     Mouth/Throat:     Mouth: Mucous  membranes are moist.  Eyes:     Extraocular Movements: Extraocular movements intact.  Cardiovascular:     Rate and Rhythm: Normal rate.     Pulses: Normal pulses.  Pulmonary:     Effort: Pulmonary effort is normal.  Abdominal:     General: Abdomen is flat. There is no distension.  Musculoskeletal:        General: Tenderness present.     Cervical back: Tenderness present.     Comments: Mild discomfort noted with flexion, extension and side-to-side rotation. Patient has good strength in the upper extremities including 5 out of 5 strength in wrist extension, long finger flexion and APB. Shoulder range of motion is full bilaterally without any sign of impingement. There is no atrophy of the hands intrinsically. Sensation intact bilaterally. Negative Hoffman's sign. Negative Spurling's sign.     Skin:    General: Skin is warm and dry.     Capillary Refill: Capillary refill takes less than 2 seconds.  Neurological:     General: No focal deficit present.     Mental Status: He is alert and oriented to person, place, and time.  Psychiatric:        Mood and Affect: Mood normal.        Behavior: Behavior normal.     Ortho Exam  Imaging: No results found.  Past Medical/Family/Surgical/Social History: Medications & Allergies reviewed per EMR, new medications updated. Patient Active Problem List   Diagnosis Date Noted   Chronic neck pain 02/17/2024   Prediabetes 02/23/2023   Bilateral nephrolithiasis 02/24/2021   Generalized abdominal pain 04/29/2020   Hypoxia 10/17/2018   Bilateral arm numbness and tingling while sleeping 07/20/2017   History of hyperlipidemia 02/23/2016   Essential hypertension 02/23/2016   Migraine with aura and without status migrainosus, not intractable 02/19/2015   Past Medical History:  Diagnosis Date   Bilateral nephrolithiasis    Borderline hypertension    Cervical radiculopathy    CKD stage 3a, GFR 45-59 ml/min (HCC)    Frequent headaches    GERD  (gastroesophageal reflux disease)    Hyperlipidemia    Hypertension    Migraines    Prediabetes    Family History  Problem Relation Age of Onset   Hypertension Mother    Alzheimer's disease Father    Hypertension Father    Kidney disease Father    Arthritis Father    Cancer Paternal Grandfather        Prostate Cancer   Alcohol abuse Neg Hx    Past Surgical History:  Procedure Laterality Date   HERNIA REPAIR     LITHOTRIPSY     Social History   Occupational History  Occupation: Education officer, environmental  Tobacco Use   Smoking status: Never    Passive exposure: Never   Smokeless tobacco: Never  Vaping Use   Vaping status: Never Used  Substance and Sexual Activity   Alcohol use: No   Drug use: Not Currently   Sexual activity: Yes    Birth control/protection: None

## 2024-04-05 ENCOUNTER — Encounter: Payer: Self-pay | Admitting: Family Medicine

## 2024-04-05 LAB — CBC
HCT: 40.3 % (ref 39.0–52.0)
Hemoglobin: 13.5 g/dL (ref 13.0–17.0)
MCHC: 33.6 g/dL (ref 30.0–36.0)
MCV: 91.3 fl (ref 78.0–100.0)
Platelets: 186 K/uL (ref 150.0–400.0)
RBC: 4.41 Mil/uL (ref 4.22–5.81)
RDW: 14.4 % (ref 11.5–15.5)
WBC: 6.5 K/uL (ref 4.0–10.5)

## 2024-04-05 LAB — COMPREHENSIVE METABOLIC PANEL WITH GFR
ALT: 15 U/L (ref 0–53)
AST: 17 U/L (ref 0–37)
Albumin: 4.2 g/dL (ref 3.5–5.2)
Alkaline Phosphatase: 60 U/L (ref 39–117)
BUN: 18 mg/dL (ref 6–23)
CO2: 26 meq/L (ref 19–32)
Calcium: 9.2 mg/dL (ref 8.4–10.5)
Chloride: 106 meq/L (ref 96–112)
Creatinine, Ser: 1.37 mg/dL (ref 0.40–1.50)
GFR: 50.23 mL/min — ABNORMAL LOW (ref >60.00)
Glucose, Bld: 96 mg/dL (ref 70–99)
Potassium: 4.5 meq/L (ref 3.5–5.1)
Sodium: 140 meq/L (ref 135–145)
Total Bilirubin: 0.9 mg/dL (ref 0.2–1.2)
Total Protein: 6.4 g/dL (ref 6.0–8.3)

## 2024-04-05 LAB — LIPID PANEL
Cholesterol: 103 mg/dL (ref 0–200)
HDL: 33.9 mg/dL — ABNORMAL LOW (ref >39.00)
LDL Cholesterol: 52 mg/dL (ref 0–99)
NonHDL: 69.5
Total CHOL/HDL Ratio: 3
Triglycerides: 87 mg/dL (ref 0.0–149.0)
VLDL: 17.4 mg/dL (ref 0.0–40.0)

## 2024-04-05 LAB — PSA, MEDICARE: PSA: 1.24 ng/mL (ref 0.10–4.00)

## 2024-04-05 LAB — HEMOGLOBIN A1C: Hgb A1c MFr Bld: 6.6 % — ABNORMAL HIGH (ref 4.6–6.5)

## 2024-04-30 ENCOUNTER — Encounter: Admitting: Physical Medicine and Rehabilitation

## 2024-05-09 ENCOUNTER — Other Ambulatory Visit: Payer: Self-pay | Admitting: Family Medicine

## 2024-05-09 DIAGNOSIS — I10 Essential (primary) hypertension: Secondary | ICD-10-CM

## 2024-05-14 ENCOUNTER — Encounter: Payer: Self-pay | Admitting: Radiology

## 2024-05-14 ENCOUNTER — Ambulatory Visit: Admitting: Physical Medicine and Rehabilitation

## 2024-05-14 ENCOUNTER — Other Ambulatory Visit: Payer: Self-pay

## 2024-05-14 VITALS — BP 150/73 | HR 58

## 2024-05-14 DIAGNOSIS — M5412 Radiculopathy, cervical region: Secondary | ICD-10-CM

## 2024-05-14 MED ORDER — METHYLPREDNISOLONE ACETATE 40 MG/ML IJ SUSP
40.0000 mg | Freq: Once | INTRAMUSCULAR | Status: AC
  Administered 2024-05-14: 40 mg

## 2024-05-14 NOTE — Progress Notes (Unsigned)
 Paul Carlson - 78 y.o. male MRN [id number]  Date of birth: 17-Apr-1946  Office Visit Note: Visit Date: 05/14/2024 PCP: Watt Harlene BROCKS, MD Referred by: Watt Harlene BROCKS, MD  Subjective: Chief Complaint  Patient presents with   Neck - Pain   HPI:  Paul Carlson is a 78 y.o. male who comes in today at the request of Duwaine Pouch, FNP for planned Right C7-T1 Cervical Interlaminar epidural steroid injection with fluoroscopic guidance.  The patient has failed conservative care including home exercise, medications, time and activity modification.  This injection will be diagnostic and hopefully therapeutic.  Please see requesting physician notes for further details and justification.   ROS Otherwise per HPI.  Assessment & Plan: Visit Diagnoses:    ICD-10-CM   1. Cervical radiculopathy  M54.12 XR C-ARM NO REPORT    Epidural Steroid injection    methylPREDNISolone acetate (DEPO-MEDROL) injection 40 mg      Plan: No additional findings.   Meds & Orders:  Meds ordered this encounter  Medications   methylPREDNISolone acetate (DEPO-MEDROL) injection 40 mg    Orders Placed This Encounter  Procedures   XR C-ARM NO REPORT   Epidural Steroid injection    Follow-up: Return for visit to requesting provider as needed.   Procedures: No procedures performed  Cervical Epidural Steroid Injection - Interlaminar Approach with Fluoroscopic Guidance  Patient: [name]      Date of Birth: 09-19-1945 MRN: [id number] PCP: Watt Harlene BROCKS, MD      Visit Date: 05/14/2024   Universal Protocol:    Date/Time: 11/03/251:08 PM  Consent Given By: the patient  Position: PRONE  Additional Comments: Vital signs were monitored before and after the procedure. Patient was prepped and draped in the usual sterile fashion. The correct patient, procedure, and site was verified.   Injection Procedure Details:   Procedure diagnoses: Cervical radiculopathy [M54.12]    Meds  Administered:  Meds ordered this encounter  Medications   methylPREDNISolone acetate (DEPO-MEDROL) injection 40 mg     Laterality: Right  Location/Site: C7-T1  Needle: 3.5 in., 20 ga. Tuohy  Needle Placement: Paramedian epidural space  Findings:  -Comments: Excellent flow of contrast into the epidural space.  Procedure Details: Using a paramedian approach from the side mentioned above, the region overlying the inferior lamina was localized under fluoroscopic visualization and the soft tissues overlying this structure were infiltrated with 4 ml. of 1% Lidocaine without Epinephrine. A # 20 gauge, Tuohy needle was inserted into the epidural space using a paramedian approach.  The epidural space was localized using loss of resistance along with contralateral oblique bi-planar fluoroscopic views.  After negative aspirate for air, blood, and CSF, a 2 ml. volume of Isovue-250 was injected into the epidural space and the flow of contrast was observed. Radiographs were obtained for documentation purposes.   The injectate was administered into the level noted above.  Additional Comments:  The patient tolerated the procedure well Dressing: 2 x 2 sterile gauze and Band-Aid    Post-procedure details: Patient was observed during the procedure. Post-procedure instructions were reviewed.  Patient left the clinic in stable condition.    Clinical History: NEUROIMAGING REPORT     STUDY DATE: 02/26/2024 PATIENT NAME: Paul Carlson DOB: [birth year] MRN: [id number]   EXAM: MRI of the cervical spine   ORDERING CLINICIAN: Onetha Epp, MD CLINICAL HISTORY: 79 year old man with neck pain, cervical radiculopathy and arm weakness and pain COMPARISON FILMS: None   TECHNIQUE: MRI of  the cervical spine was obtained utilizing 3 mm sagittal slices from the posterior fossa down to the T3-4 level with T1, T2 and inversion recovery views. In addition 4 mm axial slices from C2-3 down to T1-2 level were  included with T2 and gradient echo views. CONTRAST: None IMAGING SITE: .315   FINDINGS: :  On sagittal images, the spine is imaged from above the cervicomedullary junction to T2.   The cervicomedullary junction appears normal.  The cerebellar vermian atrophy is noted.  The spinal cord is of normal caliber and signal.      The vertebral bodies are normally aligned.   There is a mildly mottled appearance of the bone marrow usually due to reconversion associated with smoking, obesity or aging.  Endplate degenerative changes are noted at C7-T1 with fatty replacement of the marrow.   The discs and interspaces were further evaluated on axial views from C2 to T1 as follows:   C2-C3: There is disc bulging, mild right uncovertebral spurring and mild right facet hypertrophy.  This causes mild to moderate right foraminal narrowing.  No spinal stenosis or nerve root compression.   C3-C4: There is disc bulging and bilateral disc osteophyte complexes.  This narrows the central canal but not enough to be considered spinal stenosis.  There is moderately severe right greater than left foraminal narrowing that could affect the C4 nerve roots.   C4-C5: There is a left disc osteophyte complex and left facet hypertrophy combining to cause moderately severe left foraminal narrowing.  There is no spinal stenosis but the degenerative changes could affect the left C5 nerve root.   C5-C6: There is broad disc bulging, uncovertebral spurring and ligamenta flava hypertrophy causing mild spinal stenosis and moderate right and mild to moderate left foraminal narrowing.  There does not appear to be nerve root compression.   C6-C7: There is a right paramedian disc protrusion effacing the thecal surface of the thecal sac without causing spinal cord compression or signal changes.  There is mild right foraminal but no nerve root compression.   C7-T1: There are endplate degenerative changes, and small disc osteophyte complexes  causing moderate bilateral foraminal narrowing but no spinal stenosis or nerve root compression.   T1-T2: This level is unremarkable.     IMPRESSION: This MRI of the cervical spine without contrast shows the following: 1. The spinal cord appears normal. 2. At C2-C3, there are degenerative changes causing right foraminal narrowing but no spinal stenosis or nerve root compression.   3. At C3-C4, there are degenerative changes causing moderately severe right greater than left foraminal narrowing that could affect the C4 nerve roots. 4. At C4-C5, there are degenerative changes more to the left causing moderately severe left foraminal narrowing.  There is no spinal stenosis but this could affect the left C5 nerve root. 5. At C5-C6, there is mild spinal stenosis and moderate foraminal narrowing but no nerve root compression. 6. At C6-C7, there is a right paramedian disc protrusion causing mild spinal stenosis but no nerve root compression. 7. At C7 -T1, there are degenerative changes causing moderate foraminal narrowing but no spinal stenosis or nerve root compression.       INTERPRETING PHYSICIAN:  Richard A. Vear, MD, PhD, DIEDRA     Objective:  VS:  HT:    WT:   BMI:     BP:(!) 150/73  HR:(!) 58bpm  TEMP: ( )  RESP:  Physical Exam Vitals and nursing note reviewed.  Constitutional:  General: He is not in acute distress.    Appearance: Normal appearance. He is not ill-appearing.  HENT:     Head: Normocephalic and atraumatic.     Right Ear: External ear normal.     Left Ear: External ear normal.  Eyes:     Extraocular Movements: Extraocular movements intact.  Cardiovascular:     Rate and Rhythm: Normal rate.     Pulses: Normal pulses.  Abdominal:     General: There is no distension.     Palpations: Abdomen is soft.  Musculoskeletal:        General: No signs of injury.     Cervical back: Neck supple. Tenderness present. No rigidity.     Right lower leg: No edema.     Left  lower leg: No edema.     Comments: Patient has good strength in the upper extremities with 5 out of 5 strength in wrist extension long finger flexion APB.  No intrinsic hand muscle atrophy.  Negative Hoffmann's test.  Lymphadenopathy:     Cervical: No cervical adenopathy.  Skin:    Findings: No erythema or rash.  Neurological:     General: No focal deficit present.     Mental Status: He is alert and oriented to person, place, and time.     Sensory: No sensory deficit.     Motor: No weakness or abnormal muscle tone.     Coordination: Coordination normal.  Psychiatric:        Mood and Affect: Mood normal.        Behavior: Behavior normal.      Imaging: XR C-ARM NO REPORT Result Date: 05/14/2024 Please see Notes tab for imaging impression.

## 2024-05-14 NOTE — Progress Notes (Unsigned)
 Pain Scale   Average Pain 3 Patient advised his neck pain is constant and pain get worse when he moves his head patient advising his pain is in bilateral shoulder areas .        +Driver, -BT, -Dye Allergies.

## 2024-05-14 NOTE — Procedures (Unsigned)
 Cervical Epidural Steroid Injection - Interlaminar Approach with Fluoroscopic Guidance  Patient: Paul Carlson      Date of Birth: [birth year] MRN: [id number] PCP: Watt Harlene BROCKS, MD      Visit Date: 05/14/2024   Universal Protocol:    Date/Time: 11/03/251:08 PM  Consent Given By: the patient  Position: PRONE  Additional Comments: Vital signs were monitored before and after the procedure. Patient was prepped and draped in the usual sterile fashion. The correct patient, procedure, and site was verified.   Injection Procedure Details:   Procedure diagnoses: Cervical radiculopathy [M54.12]    Meds Administered:  Meds ordered this encounter  Medications   methylPREDNISolone acetate (DEPO-MEDROL) injection 40 mg     Laterality: Right  Location/Site: C7-T1  Needle: 3.5 in., 20 ga. Tuohy  Needle Placement: Paramedian epidural space  Findings:  -Comments: Excellent flow of contrast into the epidural space.  Procedure Details: Using a paramedian approach from the side mentioned above, the region overlying the inferior lamina was localized under fluoroscopic visualization and the soft tissues overlying this structure were infiltrated with 4 ml. of 1% Lidocaine without Epinephrine. A # 20 gauge, Tuohy needle was inserted into the epidural space using a paramedian approach.  The epidural space was localized using loss of resistance along with contralateral oblique bi-planar fluoroscopic views.  After negative aspirate for air, blood, and CSF, a 2 ml. volume of Isovue-250 was injected into the epidural space and the flow of contrast was observed. Radiographs were obtained for documentation purposes.   The injectate was administered into the level noted above.  Additional Comments:  The patient tolerated the procedure well Dressing: 2 x 2 sterile gauze and Band-Aid    Post-procedure details: Patient was observed during the procedure. Post-procedure instructions were  reviewed.  Patient left the clinic in stable condition.

## 2024-05-26 ENCOUNTER — Other Ambulatory Visit: Payer: Self-pay | Admitting: Family Medicine

## 2024-05-26 DIAGNOSIS — M5010 Cervical disc disorder with radiculopathy, unspecified cervical region: Secondary | ICD-10-CM

## 2024-06-23 ENCOUNTER — Other Ambulatory Visit: Payer: Self-pay | Admitting: Family Medicine

## 2024-06-23 DIAGNOSIS — R1084 Generalized abdominal pain: Secondary | ICD-10-CM

## 2024-08-02 ENCOUNTER — Ambulatory Visit: Payer: Medicare Other

## 2024-08-02 VITALS — Ht 66.5 in | Wt 159.0 lb

## 2024-08-02 DIAGNOSIS — Z0000 Encounter for general adult medical examination without abnormal findings: Secondary | ICD-10-CM

## 2024-08-02 NOTE — Patient Instructions (Addendum)
 Paul Carlson,  Thank you for taking the time for your Medicare Wellness Visit. I appreciate your continued commitment to your health goals. Please review the care plan we discussed, and feel free to reach out if I can assist you further.  Please note that Annual Wellness Visits do not include a physical exam. Some assessments may be limited, especially if the visit was conducted virtually. If needed, we may recommend an in-person follow-up with your provider.  Ongoing Care Seeing your primary care provider every 3 to 6 months helps us  monitor your health and provide consistent, personalized care.   Referrals If a referral was made during today's visit and you haven't received any updates within two weeks, please contact the referred provider directly to check on the status.  Recommended Screenings:  Health Maintenance  Topic Date Due   Zoster (Shingles) Vaccine (1 of 2) Never done   COVID-19 Vaccine (4 - 2025-26 season) 03/12/2024   Colon Cancer Screening  08/09/2024*   DTaP/Tdap/Td vaccine (2 - Td or Tdap) 02/18/2025   Medicare Annual Wellness Visit  08/02/2025   Pneumococcal Vaccine for age over 24  Completed   Flu Shot  Completed   Hepatitis C Screening  Completed   Meningitis B Vaccine  Aged Out  *Topic was postponed. The date shown is not the original due date.       08/02/2024    9:33 AM  Advanced Directives  Does Patient Have a Medical Advance Directive? No  Would patient like information on creating a medical advance directive? No - Patient declined    Vision: Annual vision screenings are recommended for early detection of glaucoma, cataracts, and diabetic retinopathy. These exams can also reveal signs of chronic conditions such as diabetes and high blood pressure.  Dental: Annual dental screenings help detect early signs of oral cancer, gum disease, and other conditions linked to overall health, including heart disease and diabetes.  Please see the attached documents for  additional preventive care recommendations.

## 2024-08-02 NOTE — Progress Notes (Signed)
 "  Chief Complaint  Patient presents with   Medicare Wellness     Subjective:   Paul Carlson is a 79 y.o. male who presents for a Medicare Annual Wellness Visit.  Visit info / Clinical Intake: Medicare Wellness Visit Type:: Subsequent Annual Wellness Visit Persons participating in visit and providing information:: patient Medicare Wellness Visit Mode:: Telephone If telephone:: video declined If Telephone or Video please confirm:: I connected with patient using audio/video enable telemedicine. I verified patient identity with two identifiers, discussed telehealth limitations, and patient agreed to proceed. Patient Location:: Home Provider Location:: Office Interpreter Needed?: No Pre-visit prep was completed: yes AWV questionnaire completed by patient prior to visit?: no Living arrangements:: lives with spouse/significant other Patient's Overall Health Status Rating: very good Typical amount of pain: some Does pain affect daily life?: no Are you currently prescribed opioids?: no  Dietary Habits and Nutritional Risks How many meals a day?: 3 Eats fruit and vegetables daily?: yes Most meals are obtained by: preparing own meals In the last 2 weeks, have you had any of the following?: none Diabetic:: no  Functional Status Activities of Daily Living (to include ambulation/medication): Independent Ambulation: Independent with device- listed below Home Assistive Devices/Equipment: Eyeglasses; Dentures (specify type) Medication Administration: Independent Home Management (perform basic housework or laundry): Independent Manage your own finances?: yes Primary transportation is: driving Concerns about vision?: no *vision screening is required for WTM* Concerns about hearing?: no  Fall Screening Falls in the past year?: 0 Number of falls in past year: 0 Was there an injury with Fall?: 0 Fall Risk Category Calculator: 0 Patient Fall Risk Level: Low Fall Risk  Fall  Risk Patient at Risk for Falls Due to: No Fall Risks Fall risk Follow up: Falls evaluation completed  Home and Transportation Safety: All rugs have non-skid backing?: yes All stairs or steps have railings?: yes Grab bars in the bathtub or shower?: (!) no Have non-skid surface in bathtub or shower?: yes Good home lighting?: yes Regular seat belt use?: yes Hospital stays in the last year:: no  Cognitive Assessment Difficulty concentrating, remembering, or making decisions? : no Will 6CIT or Mini Cog be Completed: yes What year is it?: 0 points What month is it?: 0 points Give patient an address phrase to remember (5 components): [address] [place]  About what time is it?: 0 points Count backwards from 20 to 1: 0 points Say the months of the year in reverse: 0 points Repeat the address phrase from earlier: 0 points 6 CIT Score: 0 points  Advance Directives (For Healthcare) Does Patient Have a Medical Advance Directive?: No Would patient like information on creating a medical advance directive?: No - Patient declined  Reviewed/Updated  Reviewed/Updated: Reviewed All (Medical, Surgical, Family, Medications, Allergies, Care Teams, Patient Goals)    Allergies (verified) Penicillins, Azithromycin, and Pollen extract   Current Medications (verified) Outpatient Encounter Medications as of 08/02/2024  Medication Sig   acetaminophen  (TYLENOL ) 650 MG CR tablet Take 650 mg by mouth at bedtime as needed for pain.   Rimegepant Sulfate (NURTEC) 75 MG TBDP Take 1 tablet (75 mg total) by mouth daily as needed. For migraines. Take as close to onset of migraine as possible. One daily maximum.   rosuvastatin  (CRESTOR ) 10 MG tablet TAKE 1 TABLET BY MOUTH EVERY DAY   tamsulosin (FLOMAX) 0.4 MG CAPS capsule Take 0.4 mg by mouth daily.   [DISCONTINUED] aspirin-acetaminophen -caffeine (EXCEDRIN MIGRAINE) 250-250-65 MG tablet Take 1-2 tablets by mouth as needed   [  DISCONTINUED] gabapentin   (NEURONTIN ) 400 MG capsule TAKE 3 CAPSULES (1,200 MG TOTAL) BY MOUTH AT BEDTIME.   [DISCONTINUED] ibuprofen (ADVIL,MOTRIN) 200 MG tablet Take 600 mg by mouth as needed.   [DISCONTINUED] lisinopril  (ZESTRIL ) 5 MG tablet Take 1 tablet (5 mg total) by mouth daily.   [DISCONTINUED] pantoprazole  (PROTONIX ) 40 MG tablet TAKE 1 TABLET BY MOUTH EVERY DAY   No facility-administered encounter medications on file as of 08/02/2024.    History: Past Medical History:  Diagnosis Date   Bilateral nephrolithiasis    Borderline hypertension    Cervical radiculopathy    CKD stage 3a, GFR 45-59 ml/min (HCC)    Frequent headaches    GERD (gastroesophageal reflux disease)    Hyperlipidemia    Hypertension    Migraines    Prediabetes    Past Surgical History:  Procedure Laterality Date   HERNIA REPAIR     LITHOTRIPSY     Family History  Problem Relation Age of Onset   Hypertension Mother    Alzheimer's disease Father    Hypertension Father    Kidney disease Father    Arthritis Father    Cancer Paternal Grandfather        Prostate Cancer   Alcohol abuse Neg Hx    Social History   Occupational History   Occupation: Education Officer, Environmental  Tobacco Use   Smoking status: Never    Passive exposure: Never   Smokeless tobacco: Never  Vaping Use   Vaping status: Never Used  Substance and Sexual Activity   Alcohol use: No   Drug use: Not Currently   Sexual activity: Yes    Birth control/protection: None   Tobacco Counseling Counseling given: No  SDOH Screenings   Food Insecurity: No Food Insecurity (08/02/2024)  Housing: Low Risk (08/02/2024)  Transportation Needs: No Transportation Needs (08/02/2024)  Utilities: Not At Risk (08/02/2024)  Alcohol Screen: Low Risk (07/28/2023)  Depression (PHQ2-9): Low Risk (08/02/2024)  Financial Resource Strain: Low Risk (03/29/2024)  Physical Activity: Insufficiently Active (08/02/2024)  Social Connections: Socially Integrated (08/02/2024)  Stress: No Stress Concern  Present (08/02/2024)  Tobacco Use: Low Risk (08/02/2024)  Recent Concern: Tobacco Use - Medium Risk (07/20/2024)   Received from [facility]  Health Literacy: Adequate Health Literacy (08/02/2024)   See flowsheets for full screening details  Depression Screen PHQ 2 & 9 Depression Scale- Over the past 2 weeks, how often have you been bothered by any of the following problems? Little interest or pleasure in doing things: 0 Feeling down, depressed, or hopeless (PHQ Adolescent also includes...irritable): 0 PHQ-2 Total Score: 0 Trouble falling or staying asleep, or sleeping too much: 0 Feeling tired or having little energy: 0 Poor appetite or overeating (PHQ Adolescent also includes...weight loss): 0 Feeling bad about yourself - or that you are a failure or have let yourself or your family down: 0 Trouble concentrating on things, such as reading the newspaper or watching television (PHQ Adolescent also includes...like school work): 0 Moving or speaking so slowly that other people could have noticed. Or the opposite - being so fidgety or restless that you have been moving around a lot more than usual: 0 Thoughts that you would be better off dead, or of hurting yourself in some way: 0 PHQ-9 Total Score: 0 If you checked off any problems, how difficult have these problems made it for you to do your work, take care of things at home, or get along with other people?: Not difficult at all     Goals  Addressed               This Visit's Progress     Continue physical activity (pt-stated)        Continue to stay active!             Objective:    Today's Vitals   08/02/24 0932  Weight: 159 lb (72.1 kg)  Height: 5' 6.5 (1.689 m)   Body mass index is 25.28 kg/m.  Hearing/Vision screen Hearing Screening - Comments:: Denies hearing difficulties   Vision Screening - Comments:: Wears rx glasses - up to date with routine eye exams with  [facility] Immunizations and Health  Maintenance Health Maintenance  Topic Date Due   Zoster Vaccines- Shingrix (1 of 2) Never done   COVID-19 Vaccine (4 - 2025-26 season) 03/12/2024   Colonoscopy  08/09/2024 (Originally 11/08/2020)   DTaP/Tdap/Td (2 - Td or Tdap) 02/18/2025   Medicare Annual Wellness (AWV)  08/02/2025   Pneumococcal Vaccine: 50+ Years  Completed   Influenza Vaccine  Completed   Hepatitis C Screening  Completed   Meningococcal B Vaccine  Aged Out        Assessment/Plan:  This is a routine wellness examination for Arafat.  Patient Care Team: Copland, Harlene BROCKS, MD as PCP - General (Family Medicine)  I have personally reviewed and noted the following in the patients chart:   Medical and social history Use of alcohol, tobacco or illicit drugs  Current medications and supplements including opioid prescriptions. Functional ability and status Nutritional status Physical activity Advanced directives List of other physicians Hospitalizations, surgeries, and ER visits in previous 12 months Vitals Screenings to include cognitive, depression, and falls Referrals and appointments  No orders of the defined types were placed in this encounter.  In addition, I have reviewed and discussed with patient certain preventive protocols, quality metrics, and best practice recommendations. A written personalized care plan for preventive services as well as general preventive health recommendations were provided to patient.   Rojelio LELON Blush, LPN   [id number]   Return in 53 weeks (on 08/08/2025).  After Visit Summary: (MyChart) Due to this being a telephonic visit, the after visit summary with patients personalized plan was offered to patient via MyChart   Nurse Notes: HM Addressed: Colonoscopy scheduled for 08/09/24 "

## 2024-08-10 ENCOUNTER — Encounter: Payer: Self-pay | Admitting: Family Medicine

## 2025-08-08 ENCOUNTER — Ambulatory Visit
